# Patient Record
Sex: Female | Born: 1985 | Race: Black or African American | Hispanic: No | Marital: Married | Smoking: Former smoker
Health system: Southern US, Community
[De-identification: ages and names within clinical notes are randomized; demographics above are authoritative.]

## PROBLEM LIST (undated history)

## (undated) ENCOUNTER — Inpatient Hospital Stay (HOSPITAL_COMMUNITY): Payer: Self-pay

## (undated) DIAGNOSIS — F419 Anxiety disorder, unspecified: Secondary | ICD-10-CM

## (undated) DIAGNOSIS — E669 Obesity, unspecified: Secondary | ICD-10-CM

## (undated) DIAGNOSIS — D219 Benign neoplasm of connective and other soft tissue, unspecified: Secondary | ICD-10-CM

## (undated) DIAGNOSIS — E119 Type 2 diabetes mellitus without complications: Secondary | ICD-10-CM

## (undated) DIAGNOSIS — I10 Essential (primary) hypertension: Secondary | ICD-10-CM

## (undated) DIAGNOSIS — O039 Complete or unspecified spontaneous abortion without complication: Secondary | ICD-10-CM

## (undated) DIAGNOSIS — D649 Anemia, unspecified: Secondary | ICD-10-CM

## (undated) DIAGNOSIS — Z8619 Personal history of other infectious and parasitic diseases: Secondary | ICD-10-CM

## (undated) HISTORY — DX: Type 2 diabetes mellitus without complications: E11.9

## (undated) HISTORY — PX: OTHER SURGICAL HISTORY: SHX169

## (undated) HISTORY — DX: Personal history of other infectious and parasitic diseases: Z86.19

## (undated) HISTORY — DX: Complete or unspecified spontaneous abortion without complication: O03.9

---
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
## 2007-08-28 ENCOUNTER — Emergency Department (HOSPITAL_COMMUNITY): Admission: EM | Admit: 2007-08-28 | Discharge: 2007-08-29 | Payer: Self-pay | Admitting: Emergency Medicine

## 2008-01-03 ENCOUNTER — Emergency Department (HOSPITAL_COMMUNITY): Admission: EM | Admit: 2008-01-03 | Discharge: 2008-01-03 | Payer: Self-pay | Admitting: Emergency Medicine

## 2009-01-03 ENCOUNTER — Emergency Department (HOSPITAL_COMMUNITY): Admission: EM | Admit: 2009-01-03 | Discharge: 2009-01-03 | Payer: Self-pay | Admitting: Emergency Medicine

## 2009-06-23 ENCOUNTER — Emergency Department (HOSPITAL_COMMUNITY): Admission: EM | Admit: 2009-06-23 | Discharge: 2009-06-23 | Payer: Self-pay | Admitting: Emergency Medicine

## 2010-03-14 ENCOUNTER — Emergency Department (HOSPITAL_COMMUNITY): Admission: EM | Admit: 2010-03-14 | Discharge: 2010-03-14 | Payer: Self-pay | Admitting: Family Medicine

## 2010-03-16 ENCOUNTER — Emergency Department (HOSPITAL_COMMUNITY): Admission: EM | Admit: 2010-03-16 | Discharge: 2010-03-16 | Payer: Self-pay | Admitting: Emergency Medicine

## 2010-09-19 LAB — URINE CULTURE: Culture  Setup Time: 201109082207

## 2010-09-19 LAB — POCT URINALYSIS DIPSTICK
Specific Gravity, Urine: 1.025 (ref 1.005–1.030)
Urobilinogen, UA: 1 mg/dL (ref 0.0–1.0)

## 2010-10-07 LAB — URINALYSIS, ROUTINE W REFLEX MICROSCOPIC
Hgb urine dipstick: NEGATIVE
Ketones, ur: NEGATIVE mg/dL
Nitrite: NEGATIVE
Specific Gravity, Urine: 1.026 (ref 1.005–1.030)

## 2010-10-07 LAB — URINE MICROSCOPIC-ADD ON

## 2010-10-07 LAB — POCT PREGNANCY, URINE: Preg Test, Ur: NEGATIVE

## 2010-10-29 ENCOUNTER — Emergency Department (HOSPITAL_COMMUNITY)
Admission: EM | Admit: 2010-10-29 | Discharge: 2010-10-30 | Disposition: A | Discharge status: Home / Self Care | Payer: Managed Care, Other (non HMO) | Attending: Emergency Medicine | Admitting: Emergency Medicine

## 2010-10-29 DIAGNOSIS — R059 Cough, unspecified: Secondary | ICD-10-CM | POA: Insufficient documentation

## 2010-10-29 DIAGNOSIS — R05 Cough: Secondary | ICD-10-CM | POA: Insufficient documentation

## 2010-10-29 DIAGNOSIS — J111 Influenza due to unidentified influenza virus with other respiratory manifestations: Secondary | ICD-10-CM | POA: Insufficient documentation

## 2010-10-30 ENCOUNTER — Emergency Department (HOSPITAL_COMMUNITY): Payer: Managed Care, Other (non HMO)

## 2011-08-18 ENCOUNTER — Other Ambulatory Visit: Payer: Self-pay | Admitting: Emergency Medicine

## 2011-08-18 DIAGNOSIS — R1011 Right upper quadrant pain: Secondary | ICD-10-CM

## 2011-08-20 ENCOUNTER — Other Ambulatory Visit: Payer: Managed Care, Other (non HMO)

## 2011-12-30 ENCOUNTER — Encounter (HOSPITAL_COMMUNITY): Payer: Self-pay | Admitting: Emergency Medicine

## 2011-12-30 ENCOUNTER — Emergency Department (HOSPITAL_COMMUNITY)
Admission: EM | Admit: 2011-12-30 | Discharge: 2011-12-30 | Disposition: A | Discharge status: Home / Self Care | Payer: Managed Care, Other (non HMO) | Attending: Emergency Medicine | Admitting: Emergency Medicine

## 2011-12-30 DIAGNOSIS — R109 Unspecified abdominal pain: Secondary | ICD-10-CM | POA: Insufficient documentation

## 2011-12-30 LAB — URINALYSIS, MICROSCOPIC ONLY
Bilirubin Urine: NEGATIVE
Glucose, UA: NEGATIVE mg/dL
Hgb urine dipstick: NEGATIVE
Leukocytes, UA: NEGATIVE
Specific Gravity, Urine: 1.032 — ABNORMAL HIGH (ref 1.005–1.030)
Urobilinogen, UA: 1 mg/dL (ref 0.0–1.0)

## 2011-12-30 NOTE — ED Notes (Signed)
Pt states she has pain in her right flank that started about 2 hrs ago  Pt states it varies in intensity  Pt has nausea without vomiting

## 2011-12-31 ENCOUNTER — Emergency Department (HOSPITAL_COMMUNITY): Payer: Managed Care, Other (non HMO)

## 2011-12-31 ENCOUNTER — Emergency Department (HOSPITAL_COMMUNITY)
Admission: EM | Admit: 2011-12-31 | Discharge: 2011-12-31 | Disposition: A | Discharge status: Home / Self Care | Payer: Managed Care, Other (non HMO) | Attending: Emergency Medicine | Admitting: Emergency Medicine

## 2011-12-31 ENCOUNTER — Encounter (HOSPITAL_COMMUNITY): Payer: Self-pay | Admitting: *Deleted

## 2011-12-31 DIAGNOSIS — R10813 Right lower quadrant abdominal tenderness: Secondary | ICD-10-CM | POA: Insufficient documentation

## 2011-12-31 DIAGNOSIS — M549 Dorsalgia, unspecified: Secondary | ICD-10-CM

## 2011-12-31 DIAGNOSIS — R109 Unspecified abdominal pain: Secondary | ICD-10-CM | POA: Insufficient documentation

## 2011-12-31 LAB — POCT PREGNANCY, URINE: Preg Test, Ur: NEGATIVE

## 2011-12-31 LAB — URINALYSIS, ROUTINE W REFLEX MICROSCOPIC
Glucose, UA: NEGATIVE mg/dL
Ketones, ur: NEGATIVE mg/dL
Leukocytes, UA: NEGATIVE

## 2011-12-31 MED ORDER — HYDROCODONE-ACETAMINOPHEN 5-325 MG PO TABS: 1.0000 | ORAL_TABLET | Freq: Four times a day (QID) | ORAL | Status: AC | PRN

## 2011-12-31 MED ORDER — IBUPROFEN 800 MG PO TABS: 800.0000 mg | ORAL_TABLET | Freq: Two times a day (BID) | ORAL | Status: AC

## 2011-12-31 MED ORDER — OXYCODONE-ACETAMINOPHEN 5-325 MG PO TABS
1.0000 | ORAL_TABLET | Freq: Once | ORAL | Status: AC
  Administered 2011-12-31: 1 via ORAL
  Filled 2011-12-31: qty 1

## 2011-12-31 NOTE — ED Notes (Signed)
Pt reports lower R side and R lower back pain x2 days. Constant nagging pain, sharp at times. Pain worse with movement. Denies urinary or vaginal symptoms.

## 2011-12-31 NOTE — Discharge Instructions (Signed)
Followup with orthopedics if symptoms continue. Use conservative methods at home including heat therapy and cold therapy as we discussed. More information on cold therapy is listed below.  It is not reccommended to use heat treatment directly after an acute injury. ° °SEEK IMMEDIATE MEDICAL ATTENTION IF: °New numbness, tingling, weakness, or problem with the use of your arms or legs.  °Severe back pain not relieved with medications.  °Change in bowel or bladder control.  °Increasing pain in any areas of the body (such as chest or abdominal pain).  °Shortness of breath, dizziness or fainting.  °Nausea (feeling sick to your stomach), vomiting, fever, or sweats. ° °COLD THERAPY DIRECTIONS:  °Ice or gel packs can be used to reduce both pain and swelling. Ice is the most helpful within the first 24 to 48 hours after an injury or flareup from overusing a muscle or joint.  Ice is effective, has very few side effects, and is safe for most people to use.  ° °If you expose your skin to cold temperatures for too long or without the proper protection, you can damage your skin or nerves. Watch for signs of skin damage due to cold.  ° °HOME CARE INSTRUCTIONS  °Follow these tips to use ice and cold packs safely.  °Place a dry or damp towel between the ice and skin. A damp towel will cool the skin more quickly, so you may need to shorten the time that the ice is used.  °For a more rapid response, add gentle compression to the ice.  °Ice for no more than 10 to 20 minutes at a time. The bonier the area you are icing, the less time it will take to get the benefits of ice.  °Check your skin after 5 minutes to make sure there are no signs of a poor response to cold or skin damage.  °Rest 20 minutes or more in between uses.  °Once your skin is numb, you can end your treatment. You can test numbness by very lightly touching your skin. The touch should be so light that you do not see the skin dimple from the pressure of your fingertip.  When using ice, most people will feel these normal sensations in this order: cold, burning, aching, and numbness.  °Do not use ice on someone who cannot communicate their responses to pain, such as small children or people with dementia.  ° °HOW TO MAKE AN ICE PACK  °To make an ice pack, do one of the following:  °Place crushed ice or a bag of frozen vegetables in a sealable plastic bag. Squeeze out the excess air. Place this bag inside another plastic bag. Slide the bag into a pillowcase or place a damp towel between your skin and the bag.  °Mix 3 parts water with 1 part rubbing alcohol. Freeze the mixture in a sealable plastic bag. When you remove the mixture from the freezer, it will be slushy. Squeeze out the excess air. Place this bag inside another plastic bag. Slide the bag into a pillowcase or place a damp towel between your skin and the bag.  ° °SEEK MEDICAL CARE IF:  °You develop white spots on your skin. This may give the skin a blotchy (mottled) appearance.  °Your skin turns blue or pale.  °Your skin becomes waxy or hard.  °Your swelling gets worse.  °MAKE SURE YOU:  °Understand these instructions.  °Will watch your condition.  °Will get help right away if you are not doing well or   get worse.  ° ° ° ° ° ° °

## 2011-12-31 NOTE — ED Provider Notes (Signed)
History     CSN: [id number]  Arrival date & time 12/31/11  1335   First MD Initiated Contact with Patient 12/31/11 1635      Chief Complaint  Patient presents with  . Back Pain    (Consider location/radiation/quality/duration/timing/severity/associated sxs/prior treatment) HPI Comments: Patient with no significant past medical history presents emergency department with right-sided flank pain.  Onset of symptoms began 2 days ago and is described as a constant nagging pain that is sharp at times.  Pain radiates to right lower quadrant.  Associated symptoms include dysuria and urinary frequency.  Patient denies any vaginal discharge, dyspareunia, fever, night sweats, chills, abdominal pain, nausea, vomiting, constipation.  No other complaint at this time.  Patient is a 26 y.o. female presenting with back pain. The history is provided by the patient.  Back Pain     History reviewed. No pertinent past medical history.  Past Surgical History  Procedure Date  . Extraction of wisdom teeth     Family History  Problem Relation Age of Onset  . Hypertension Other   . Diabetes Other     History  Substance Use Topics  . Smoking status: Never Smoker   . Smokeless tobacco: Not on file  . Alcohol Use: No    OB History    Grav Para Term Preterm Abortions TAB SAB Ect Mult Living                  Review of Systems  Musculoskeletal: Positive for back pain.  All other systems reviewed and are negative.    Allergies  Review of patient's allergies indicates no known allergies.  Home Medications  No current outpatient prescriptions on file.  BP 126/44  Pulse 85  Temp 98.6 F (37 C) (Oral)  Resp 16  Ht 5\' 3"  (1.6 m)  Wt 225 lb (102.059 kg)  BMI 39.86 kg/m2  SpO2 100%  LMP 12/12/2011  Physical Exam  Nursing note and vitals reviewed. Constitutional: She is oriented to person, place, and time. She appears well-developed and well-nourished. No distress.  HENT:  Head:  Normocephalic and atraumatic.  Eyes: Conjunctivae and EOM are normal.  Neck: Normal range of motion.  Pulmonary/Chest: Effort normal.  Abdominal: There is tenderness in the right lower quadrant. There is CVA tenderness (right ). There is no rigidity, no guarding, no tenderness at McBurney's point and negative Murphy's sign.  Musculoskeletal: Normal range of motion.  Neurological: She is alert and oriented to person, place, and time.  Skin: Skin is warm and dry. No rash noted. She is not diaphoretic.  Psychiatric: She has a normal mood and affect. Her behavior is normal.    ED Course  Procedures (including critical care time)   Labs Reviewed  URINALYSIS, ROUTINE W REFLEX MICROSCOPIC  POCT PREGNANCY, URINE   Ct Abdomen Pelvis Wo Contrast  12/31/2011  *RADIOLOGY REPORT*  Clinical Data: Right back and right flank pain.  CT ABDOMEN AND PELVIS WITHOUT CONTRAST  Technique:  Multidetector CT imaging of the abdomen and pelvis was performed following the standard protocol without intravenous contrast.  Comparison: None.  Findings: The visualized portion of the liver, spleen, pancreas, and adrenal glands appear unremarkable in noncontrast CT appearance.  The gallbladder and biliary system appear unremarkable.  The kidneys appear unremarkable, as do the proximal ureters.  No pathologic retroperitoneal or porta hepatis adenopathy is identified.  No pathologic pelvic adenopathy is identified.  Trace free pelvic fluid noted.  The appendix appears normal.  IMPRESSION:  1.  No specific abnormality is observed explain the patient's right- sided back pain. 2.  Trace free pelvic fluid, possibly physiologic.  Original Report Authenticated By: Dellia Cloud, M.D.     No diagnosis found.   Patient refused pelvic exam  MDM  Back pain radiating to RLQ  Patient with back pain.  No neurological deficits and normal neuro exam.  Patient can walk but states is painful.  No loss of bowel or bladder control.   No concern for cauda equina. No UTI or kidney stone. No fever, night sweats, weight loss, h/o cancer, IVDU.  RICE protocol and pain medicine indicated and discussed with patient.          Jaci Carrel, [place] 12/31/11 2001

## 2012-01-01 NOTE — ED Provider Notes (Signed)
Medical screening examination/treatment/procedure(s) were performed by non-physician practitioner and as supervising physician I was immediately available for consultation/collaboration.   Brittley Regner Y. Netanel Yannuzzi, MD 01/01/12 0019 

## 2012-01-16 ENCOUNTER — Encounter (HOSPITAL_COMMUNITY): Payer: Self-pay | Admitting: *Deleted

## 2012-01-16 ENCOUNTER — Inpatient Hospital Stay (HOSPITAL_COMMUNITY)
Admission: AD | Admit: 2012-01-16 | Discharge: 2012-01-17 | Disposition: A | Discharge status: Home / Self Care | Payer: Managed Care, Other (non HMO) | Source: Ambulatory Visit | Attending: Obstetrics and Gynecology | Admitting: Obstetrics and Gynecology

## 2012-01-16 ENCOUNTER — Inpatient Hospital Stay (HOSPITAL_COMMUNITY): Payer: Managed Care, Other (non HMO)

## 2012-01-16 DIAGNOSIS — M549 Dorsalgia, unspecified: Secondary | ICD-10-CM

## 2012-01-16 DIAGNOSIS — O219 Vomiting of pregnancy, unspecified: Secondary | ICD-10-CM

## 2012-01-16 DIAGNOSIS — R1031 Right lower quadrant pain: Secondary | ICD-10-CM

## 2012-01-16 DIAGNOSIS — O210 Mild hyperemesis gravidarum: Secondary | ICD-10-CM | POA: Insufficient documentation

## 2012-01-16 HISTORY — DX: Anemia, unspecified: D64.9

## 2012-01-16 LAB — URINALYSIS, ROUTINE W REFLEX MICROSCOPIC
Bilirubin Urine: NEGATIVE
Nitrite: NEGATIVE
Protein, ur: NEGATIVE mg/dL
Specific Gravity, Urine: 1.025 (ref 1.005–1.030)
pH: 6 (ref 5.0–8.0)

## 2012-01-16 LAB — CBC WITH DIFFERENTIAL/PLATELET
Eosinophils Relative: 2 % (ref 0–5)
HCT: 34.3 % — ABNORMAL LOW (ref 36.0–46.0)
Hemoglobin: 10.9 g/dL — ABNORMAL LOW (ref 12.0–15.0)
Lymphocytes Relative: 25 % (ref 12–46)
Lymphs Abs: 2.3 10*3/uL (ref 0.7–4.0)
MCV: 70.4 fL — ABNORMAL LOW (ref 78.0–100.0)
Monocytes Absolute: 0.9 10*3/uL (ref 0.1–1.0)
Monocytes Relative: 9 % (ref 3–12)
RBC: 4.87 MIL/uL (ref 3.87–5.11)
RDW: 14.9 % (ref 11.5–15.5)
WBC: 9.4 10*3/uL (ref 4.0–10.5)

## 2012-01-16 LAB — URINE MICROSCOPIC-ADD ON

## 2012-01-16 LAB — WET PREP, GENITAL: Yeast Wet Prep HPF POC: NONE SEEN

## 2012-01-16 LAB — POCT PREGNANCY, URINE: Preg Test, Ur: POSITIVE — AB

## 2012-01-16 MED ORDER — PROMETHAZINE HCL 25 MG PO TABS: 25.0000 mg | ORAL_TABLET | Freq: Four times a day (QID) | ORAL | Status: DC | PRN

## 2012-01-16 MED ORDER — DOCUSATE SODIUM 100 MG PO CAPS: 100.0000 mg | ORAL_CAPSULE | Freq: Two times a day (BID) | ORAL | Status: AC

## 2012-01-16 MED ORDER — ONDANSETRON 8 MG PO TBDP: 8.0000 mg | ORAL_TABLET | Freq: Three times a day (TID) | ORAL | Status: AC | PRN

## 2012-01-16 MED ORDER — PROMETHAZINE HCL 25 MG PO TABS
25.0000 mg | ORAL_TABLET | Freq: Once | ORAL | Status: AC
  Administered 2012-01-16: 25 mg via ORAL
  Filled 2012-01-16: qty 1

## 2012-01-16 NOTE — MAU Note (Signed)
Pt states her rt sided back/flank and lumbar pain started about 3 days ago.  Intermittent in nature and describes as a dull pain.

## 2012-01-16 NOTE — MAU Provider Note (Signed)
History     CSN: [id number]  Arrival date and time: 01/16/12 2002   First Provider Initiated Contact with Patient 01/16/12 2120      Chief Complaint  Patient presents with  . Emesis During Pregnancy  . Back Pain   HPI Pt is G1P0 5 weeks 0 days pregnant and complains of right back pain since 01/13/2012- she had a fever no chills 3 days ago.  She has been nauseated and vomiting for 3 days.  She ate at 3:30pm a slice of pizza which did not stay down.  She denies vaginal discharge, bleeding or UTI symptoms.  She has been constipated with last bowel movement 3 days ago, which was hard.  She has had this pain like this and was seen several weeks ago at the ED and told she had a muscle strain and was given hydrocodone for pain.  She also has some lower abdominal cramping pain.    Past Medical History  Diagnosis Date  . Anemia     Past Surgical History  Procedure Date  . Extraction of wisdom teeth     Family History  Problem Relation Age of Onset  . Hypertension Other   . Diabetes Other   . Other Neg Hx     History  Substance Use Topics  . Smoking status: Former Smoker    Quit date: 01/16/2011  . Smokeless tobacco: Not on file  . Alcohol Use: No    Allergies: No Known Allergies  Prescriptions prior to admission  Medication Sig Dispense Refill  . HYDROcodone-acetaminophen (NORCO) 5-325 MG per tablet Take 1 tablet by mouth every 6 (six) hours as needed. Back pain        Review of Systems  Constitutional: Negative for fever and chills.  Gastrointestinal: Positive for nausea, vomiting and abdominal pain.  Genitourinary: Negative for dysuria, urgency and frequency.  Musculoskeletal: Positive for back pain.  Neurological: Negative for headaches.   Physical Exam   Blood pressure 132/65, pulse 89, temperature 98.4 F (36.9 C), temperature source Oral, resp. rate 16, height 6\' 2"  (1.88 m), weight 228 lb 12.8 oz (103.783 kg), last menstrual period 12/12/2011, SpO2  100.00%.  Physical Exam  Nursing note and vitals reviewed. Constitutional: She is oriented to person, place, and time. She appears well-developed and well-nourished.  HENT:  Head: Normocephalic.  Eyes: Pupils are equal, round, and reactive to light.  Neck: Normal range of motion. Neck supple.  Cardiovascular: Normal rate.   Respiratory: Effort normal.  GI: Soft. Bowel sounds are normal. She exhibits no distension. There is no tenderness. There is no rebound and no guarding.       No CVA tenderness  Genitourinary: Vagina normal. No vaginal discharge found.       Uterus irregular with multiple fibroids palpated- nontender; uterine size difficult to assess due to fibroids; no appreciable adnexal tenderness or enlargement  Musculoskeletal: Normal range of motion.  Neurological: She is alert and oriented to person, place, and time.  Skin: Skin is warm and dry.  Psychiatric: She has a normal mood and affect.    MAU Course  Procedures Results for orders placed during the hospital encounter of 01/16/12 (from the past 24 hour(s))  URINALYSIS, ROUTINE W REFLEX MICROSCOPIC     Status: Abnormal   Collection Time   01/16/12  8:21 PM      Component Value Range   Color, Urine YELLOW  YELLOW   APPearance CLEAR  CLEAR   Specific Gravity, Urine 1.025  1.005 -  1.030   pH 6.0  5.0 - 8.0   Glucose, UA NEGATIVE  NEGATIVE mg/dL   Hgb urine dipstick NEGATIVE  NEGATIVE   Bilirubin Urine NEGATIVE  NEGATIVE   Ketones, ur 15 (*) NEGATIVE mg/dL   Protein, ur NEGATIVE  NEGATIVE mg/dL   Urobilinogen, UA 4.0 (*) 0.0 - 1.0 mg/dL   Nitrite NEGATIVE  NEGATIVE   Leukocytes, UA TRACE (*) NEGATIVE  URINE MICROSCOPIC-ADD ON     Status: Abnormal   Collection Time   01/16/12  8:21 PM      Component Value Range   Squamous Epithelial / LPF MANY (*) RARE   WBC, UA 3-6  <3 WBC/hpf   Bacteria, UA FEW (*) RARE   Urine-Other MUCOUS PRESENT    POCT PREGNANCY, URINE     Status: Abnormal   Collection Time   01/16/12   8:28 PM      Component Value Range   Preg Test, Ur POSITIVE (*) NEGATIVE  CBC WITH DIFFERENTIAL     Status: Abnormal   Collection Time   01/16/12  9:06 PM      Component Value Range   WBC 9.4  4.0 - 10.5 K/uL   RBC 4.87  3.87 - 5.11 MIL/uL   Hemoglobin 10.9 (*) 12.0 - 15.0 g/dL   HCT 04.5 (*) 40.9 - 81.1 %   MCV 70.4 (*) 78.0 - 100.0 fL   MCH 22.4 (*) 26.0 - 34.0 pg   MCHC 31.8  30.0 - 36.0 g/dL   RDW 91.4  78.2 - 95.6 %   Platelets 321  150 - 400 K/uL   Neutrophils Relative 64  43 - 77 %   Neutro Abs 6.0  1.7 - 7.7 K/uL   Lymphocytes Relative 25  12 - 46 %   Lymphs Abs 2.3  0.7 - 4.0 K/uL   Monocytes Relative 9  3 - 12 %   Monocytes Absolute 0.9  0.1 - 1.0 K/uL   Eosinophils Relative 2  0 - 5 %   Eosinophils Absolute 0.2  0.0 - 0.7 K/uL   Basophils Relative 0  0 - 1 %   Basophils Absolute 0.0  0.0 - 0.1 K/uL  HCG, QUANTITATIVE, PREGNANCY     Status: Abnormal   Collection Time   01/16/12  9:06 PM      Component Value Range   hCG, Beta Chain, Quant, S 14122 (*) <5 mIU/mL  ABO/RH     Status: Normal   Collection Time   01/16/12  9:06 PM      Component Value Range   ABO/RH(D) O POS    pt given PO Phenergan 25mg  and was able to tolerate PO fluids Ultrasound showed IUGS measuring [redacted]w[redacted]d with YS but no fetal pole or embryo; multiple small uterine  fibroids noted; no adnexal mass or free fluid identified Discussed with Dr. Claiborne Billings- will have pt follow up on Monday morning for repeat [facility]- sooner if bleeding or pain; will give prescriptions for Phenergan and Zofran Assessment and Plan  Nausea and vomiting in Pregnancy HCG 14,122 with IUGS and YS but no fetal pole F/u in 2 days for repeat HCG   Ghadeer Kastelic 01/16/2012, 9:21 PM

## 2012-01-20 ENCOUNTER — Other Ambulatory Visit: Payer: Self-pay | Admitting: Internal Medicine

## 2012-01-20 ENCOUNTER — Other Ambulatory Visit (HOSPITAL_COMMUNITY): Payer: Self-pay | Admitting: Nurse Practitioner

## 2012-01-20 DIAGNOSIS — M549 Dorsalgia, unspecified: Secondary | ICD-10-CM

## 2012-01-20 DIAGNOSIS — R1031 Right lower quadrant pain: Secondary | ICD-10-CM

## 2012-04-07 ENCOUNTER — Encounter (HOSPITAL_COMMUNITY): Payer: Self-pay | Admitting: *Deleted

## 2012-04-07 ENCOUNTER — Inpatient Hospital Stay (HOSPITAL_COMMUNITY)
Admission: AD | Admit: 2012-04-07 | Discharge: 2012-04-07 | Disposition: A | Discharge status: Home / Self Care | Payer: Managed Care, Other (non HMO) | Source: Ambulatory Visit | Attending: Obstetrics & Gynecology | Admitting: Obstetrics & Gynecology

## 2012-04-07 DIAGNOSIS — R109 Unspecified abdominal pain: Secondary | ICD-10-CM | POA: Insufficient documentation

## 2012-04-07 DIAGNOSIS — O99891 Other specified diseases and conditions complicating pregnancy: Secondary | ICD-10-CM | POA: Insufficient documentation

## 2012-04-07 DIAGNOSIS — O99619 Diseases of the digestive system complicating pregnancy, unspecified trimester: Secondary | ICD-10-CM

## 2012-04-07 HISTORY — DX: Benign neoplasm of connective and other soft tissue, unspecified: D21.9

## 2012-04-07 LAB — URINALYSIS, ROUTINE W REFLEX MICROSCOPIC
Glucose, UA: NEGATIVE mg/dL
Hgb urine dipstick: NEGATIVE
Leukocytes, UA: NEGATIVE
Specific Gravity, Urine: 1.02 (ref 1.005–1.030)
pH: 6 (ref 5.0–8.0)

## 2012-04-07 NOTE — MAU Note (Signed)
Constant pelvic pressure/pain for past hour.

## 2012-04-07 NOTE — MAU Provider Note (Signed)
Chief Complaint: pelvic pressure    First Provider Initiated Contact with Patient 04/07/12 1904     SUBJECTIVE HPI: April Lucas is a 26 y.o. G2P1001 at [redacted]w[redacted]d by LMP who presents with low abdominal pressure x 1-2 hours and severe constipation. Only had 1 small BM in the past two weeks. Denies VB, LOF, vaginal discharge, urinary complaints, N/V, fever. Has not tried anything for constipation.   Past Medical History  Diagnosis Date  . Anemia   . Fibroid    OB History    Grav Para Term Preterm Abortions TAB SAB Ect Mult Living   2 1 1       1      # Outc Date GA Lbr Len/2nd Wgt Sex Del Anes PTL Lv   1 TRM 4/07    F SVD EPI No Yes   2 CUR              Past Surgical History  Procedure Date  . Extraction of wisdom teeth    History   Social History  . Marital Status: Single    Spouse Name: N/A    Number of Children: N/A  . Years of Education: N/A   Occupational History  . Not on file.   Social History Main Topics  . Smoking status: Former Games developer  . Smokeless tobacco: Never Used   Comment: as teenager  . Alcohol Use: No     not with preg  . Drug Use: No  . Sexually Active: Yes    Birth Control/ Protection: None   Other Topics Concern  . Not on file   Social History Narrative  . No narrative on file   No current facility-administered medications on file prior to encounter.   Current Outpatient Prescriptions on File Prior to Encounter  Medication Sig Dispense Refill  . promethazine (PHENERGAN) 25 MG tablet Take 1 tablet (25 mg total) by mouth every 6 (six) hours as needed for nausea.  30 tablet  0   No Known Allergies  ROS: Pertinent items in HPI  OBJECTIVE Blood pressure 128/73, pulse 97, temperature 98.4 F (36.9 C), temperature source Oral, resp. rate 20, height 5\' 3"  (1.6 m), weight 103.42 kg (228 lb), last menstrual period 12/12/2011. GENERAL: Well-developed, well-nourished female in no acute distress.  HEENT: Normocephalic HEART: normal rate RESP:  normal effort ABDOMEN: Soft, non-tender EXTREMITIES: Nontender, no edema NEURO: Alert and oriented SPECULUM EXAM: deferred BIMANUAL: cervix long and closed; 18 week size, no adnexal tenderness or masses. Stool in rectum. FHR 165 by doppler  LAB RESULTS Results for orders placed during the hospital encounter of 04/07/12 (from the past 24 hour(s))  URINALYSIS, ROUTINE W REFLEX MICROSCOPIC     Status: Abnormal   Collection Time   04/07/12  5:50 PM      Component Value Range   Color, Urine YELLOW  YELLOW   APPearance CLEAR  CLEAR   Specific Gravity, Urine 1.020  1.005 - 1.030   pH 6.0  5.0 - 8.0   Glucose, UA NEGATIVE  NEGATIVE mg/dL   Hgb urine dipstick NEGATIVE  NEGATIVE   Bilirubin Urine NEGATIVE  NEGATIVE   Ketones, ur 40 (*) NEGATIVE mg/dL   Protein, ur NEGATIVE  NEGATIVE mg/dL   Urobilinogen, UA 0.2  0.0 - 1.0 mg/dL   Nitrite NEGATIVE  NEGATIVE   Leukocytes, UA NEGATIVE  NEGATIVE    IMAGING No results found.  MAU COURSE Pt offered soap suds enema, declined. VM left w/ Dr. Arlyce Dice.  ASSESSMENT  1. Constipation in pregnancy    PLAN Discharge home You may use Miralax or Milk of Magnesia daily until soft, daily bowel movement. Then take Colace 1-2 times per day for maintenance of regular BMs. If no BM in 24 hours use a Fleets enema. If no BM in 48 hours return to MAU for soap suds enema.   Follow-up Information    Follow up with Mickel Baas, MD. (as scheduled )    Contact information:   [address] [place] [address] [phone]       Follow up with [facility] [place] MATERNITY ADMISSIONS. (As needed if no relief in 48 hours.)    Contact information:   [address] [id number] mc [place] [address] [phone]          Medication List     As of 04/07/2012  7:15 PM    ASK your doctor about these medications         prenatal multivitamin Tabs   Take 1 tablet by mouth every morning.       promethazine 25 MG tablet   Commonly known as: PHENERGAN   Take 1 tablet (25 mg total) by mouth every 6 (six) hours as needed for nausea.        [place], CNM 04/07/2012  7:15 PM

## 2012-04-09 ENCOUNTER — Inpatient Hospital Stay (HOSPITAL_COMMUNITY): Payer: Managed Care, Other (non HMO)

## 2012-04-09 ENCOUNTER — Encounter (HOSPITAL_COMMUNITY): Payer: Self-pay | Admitting: *Deleted

## 2012-04-09 ENCOUNTER — Inpatient Hospital Stay (HOSPITAL_COMMUNITY)
Admission: AD | Admit: 2012-04-09 | Discharge: 2012-04-09 | Disposition: A | Discharge status: Home / Self Care | Payer: Managed Care, Other (non HMO) | Source: Ambulatory Visit | Attending: Obstetrics and Gynecology | Admitting: Obstetrics and Gynecology

## 2012-04-09 DIAGNOSIS — R1032 Left lower quadrant pain: Secondary | ICD-10-CM | POA: Insufficient documentation

## 2012-04-09 DIAGNOSIS — M545 Low back pain, unspecified: Secondary | ICD-10-CM | POA: Insufficient documentation

## 2012-04-09 DIAGNOSIS — O4690 Antepartum hemorrhage, unspecified, unspecified trimester: Secondary | ICD-10-CM

## 2012-04-09 DIAGNOSIS — O209 Hemorrhage in early pregnancy, unspecified: Secondary | ICD-10-CM | POA: Insufficient documentation

## 2012-04-09 DIAGNOSIS — O3410 Maternal care for benign tumor of corpus uteri, unspecified trimester: Secondary | ICD-10-CM | POA: Insufficient documentation

## 2012-04-09 DIAGNOSIS — D259 Leiomyoma of uterus, unspecified: Secondary | ICD-10-CM | POA: Insufficient documentation

## 2012-04-09 LAB — URINALYSIS, ROUTINE W REFLEX MICROSCOPIC
Glucose, UA: NEGATIVE mg/dL
Leukocytes, UA: NEGATIVE
Nitrite: NEGATIVE
pH: 6 (ref 5.0–8.0)

## 2012-04-09 MED ORDER — NALBUPHINE SYRINGE 5 MG/0.5 ML
10.0000 mg | INJECTION | INTRAMUSCULAR | Status: DC | PRN
  Administered 2012-04-09: 10 mg via INTRAVENOUS
  Filled 2012-04-09: qty 1

## 2012-04-09 MED ORDER — IBUPROFEN 600 MG PO TABS
600.0000 mg | ORAL_TABLET | Freq: Once | ORAL | Status: AC
  Administered 2012-04-09: 600 mg via ORAL
  Filled 2012-04-09: qty 1

## 2012-04-09 MED ORDER — LACTATED RINGERS IV SOLN
INTRAVENOUS | Status: DC
  Administered 2012-04-09: 22:00:00 via INTRAVENOUS

## 2012-04-09 MED ORDER — LACTATED RINGERS IV SOLN: Freq: Once | INTRAVENOUS | Status: DC

## 2012-04-09 NOTE — MAU Note (Signed)
Low back pain -starts in back comes around to front since she left.  Has had a bowel movement since she was here.  Has been nauseated. Cramping with urination

## 2012-04-09 NOTE — MAU Provider Note (Signed)
History     CSN: [id number]  Arrival date and time: 04/09/12 1753   First Provider Initiated Contact with Patient 04/09/12 2114      Chief Complaint  Patient presents with  . Back Pain   HPI This is a 26 y.o. female at [redacted]w[redacted]d who presents for eval of LLQ pain which she has had for 2 days. States it also hurts in her lower back. ?some tightening. Was seen 2 days ago and found to be very constipated. States she had a BM but did not get pain relief afterward. Denies leaking or bleeding. Has nausea. Denies fever or vomiting.   OB History    Grav Para Term Preterm Abortions TAB SAB Ect Mult Living   2 1 1       1       Past Medical History  Diagnosis Date  . Anemia   . Fibroid     Past Surgical History  Procedure Date  . Extraction of wisdom teeth     Family History  Problem Relation Age of Onset  . Hypertension Other   . Diabetes Other   . Other Neg Hx   . Hearing loss Neg Hx   . Hypertension Father   . Diabetes Father     History  Substance Use Topics  . Smoking status: Former Games developer  . Smokeless tobacco: Never Used   Comment: as teenager  . Alcohol Use: No     not with preg    Allergies: No Known Allergies  Prescriptions prior to admission  Medication Sig Dispense Refill  . acetaminophen (TYLENOL) 500 MG tablet Take 500 mg by mouth every 6 (six) hours as needed. Lower abdominal pain      . Prenatal Vit-Fe Fumarate-FA (PRENATAL MULTIVITAMIN) TABS Take 1 tablet by mouth every morning.      . promethazine (PHENERGAN) 25 MG tablet Take 1 tablet (25 mg total) by mouth every 6 (six) hours as needed for nausea.  30 tablet  0    ROS See HPI  Physical Exam   Blood pressure 137/74, pulse 112, temperature 98.6 F (37 C), temperature source Oral, resp. rate 20, last menstrual period 12/12/2011.  Physical Exam  Constitutional: She is oriented to person, place, and time. She appears well-developed and well-nourished. No distress.  Cardiovascular: Normal rate.     Respiratory: Effort normal.  GI: Soft. She exhibits no distension and no mass. There is tenderness (throughout, but more in LLQ). There is no rebound and no guarding.  Genitourinary: Uterus normal. Vaginal discharge (dark red/brown on glove) found.       Cervix 1cm ext os/closed internal os/ ?25% effaced/funneled?/-3  Musculoskeletal: Normal range of motion.  Neurological: She is alert and oriented to person, place, and time.  Skin: Skin is warm and dry.  Psychiatric: She has a normal mood and affect.   Results for orders placed during the hospital encounter of 04/09/12 (from the past 24 hour(s))  URINALYSIS, ROUTINE W REFLEX MICROSCOPIC     Status: Abnormal   Collection Time   04/09/12  6:59 PM      Component Value Range   Color, Urine YELLOW  YELLOW   APPearance CLEAR  CLEAR   Specific Gravity, Urine >1.030 (*) 1.005 - 1.030   pH 6.0  5.0 - 8.0   Glucose, UA NEGATIVE  NEGATIVE mg/dL   Hgb urine dipstick NEGATIVE  NEGATIVE   Bilirubin Urine NEGATIVE  NEGATIVE   Ketones, ur >80 (*) NEGATIVE mg/dL   Protein, ur  NEGATIVE  NEGATIVE mg/dL   Urobilinogen, UA 0.2  0.0 - 1.0 mg/dL   Nitrite NEGATIVE  NEGATIVE   Leukocytes, UA NEGATIVE  NEGATIVE   US Ob Limited  04/09/2012  *RADIOLOGY REPORT*  Clinical Data: Vaginal bleeding.  Uterine fibroids.  Obesity.  17 weeks 0 days pregnant by last menstrual period and 18 weeks and 0 days pregnant by previous ultrasound.  TRANSABDOMINAL AND TRANSVAGINAL LIMITED OBSTETRIC ULTRASOUND  Comparison:  01/16/2012.  Number of Fetuses: 1 Heart Rate: 156 bpm Movement: Yes Presentation: Cephalic Placental Location: Anterior Previa: No Amniotic Fluid (Subjective): Normal  Vertical Pocket 5.8 cm  BPD:  3.87 cm     17 w  6 d  MATERNAL FINDINGS: Cervix: Closed, 3.1 cm in length. Uterus/Adnexae:  4.7 x 4.6 x 4.5 cm rounded, mildly heterogeneous mass in the uterus on the left.  3.0 x 2.9 x 2.4 cm oval, similar appearing mass in the posterior aspect of the mid uterus.  Additional 2.8 x 2.6 x 2.3 cm similar appearing left lateral uterine mass.  IMPRESSION:  1.  Single live intrauterine gestation with an estimated gestational age by BPD of 17 weeks and 6 days.  This represents normal growth since the previous ultrasound. 2.  Interval mild increase in size of the three uterine fibroids. 3.  No acute abnormality.  Recommend followup with non-emergent complete OB 14+ wk US examination for fetal biometric evaluation and anatomic survey if not already performed.   Original Report Authenticated By: Darrol Angel, M.D.     MAU Course  Procedures  MDM Discussed with Dr Henderson Cloud >> will check [place] for cx length, placenta and fibroids and IV hydrate >> feels better after fluids. Cramping decreased.  Assessment and Plan  A:  SIUP at [redacted]w[redacted]d       Uterine cramping      Bleeding possibly due to cramping      No previa      Uterine fibroids with interval increase in size      Normal cervical length  P:  Discharge       Pelvic rest       Ibuprofen for 24 hrs       Followup with office PRN and as scheduled  [facility] 04/09/2012, 9:34 PM

## 2012-06-04 ENCOUNTER — Encounter (HOSPITAL_COMMUNITY): Payer: Self-pay | Admitting: Emergency Medicine

## 2012-06-04 ENCOUNTER — Emergency Department (HOSPITAL_COMMUNITY)
Admission: EM | Admit: 2012-06-04 | Discharge: 2012-06-05 | Disposition: A | Discharge status: Home / Self Care | Payer: Managed Care, Other (non HMO) | Attending: Emergency Medicine | Admitting: Emergency Medicine

## 2012-06-04 DIAGNOSIS — K089 Disorder of teeth and supporting structures, unspecified: Secondary | ICD-10-CM | POA: Insufficient documentation

## 2012-06-04 DIAGNOSIS — Z8742 Personal history of other diseases of the female genital tract: Secondary | ICD-10-CM | POA: Insufficient documentation

## 2012-06-04 DIAGNOSIS — Z862 Personal history of diseases of the blood and blood-forming organs and certain disorders involving the immune mechanism: Secondary | ICD-10-CM | POA: Insufficient documentation

## 2012-06-04 DIAGNOSIS — K0889 Other specified disorders of teeth and supporting structures: Secondary | ICD-10-CM

## 2012-06-04 DIAGNOSIS — Z87891 Personal history of nicotine dependence: Secondary | ICD-10-CM | POA: Insufficient documentation

## 2012-06-04 DIAGNOSIS — O9989 Other specified diseases and conditions complicating pregnancy, childbirth and the puerperium: Secondary | ICD-10-CM | POA: Insufficient documentation

## 2012-06-04 MED ORDER — OXYCODONE-ACETAMINOPHEN 5-325 MG PO TABS
2.0000 | ORAL_TABLET | Freq: Once | ORAL | Status: AC
  Administered 2012-06-04: 2 via ORAL
  Filled 2012-06-04: qty 2

## 2012-06-04 NOTE — ED Notes (Signed)
Tia, PA at bedside 

## 2012-06-04 NOTE — ED Notes (Addendum)
PT. REPORTS LEFT UPPER AND LOWER MOLAR PAIN FOR SEVERAL DAYS WORSE THIS EVENING UNRELIEVED BY OTC PAIN MEDICATIONS. STATES 6 MONTHS PREGNANT G2P1 .

## 2012-06-04 NOTE — ED Provider Notes (Signed)
History     CSN: [id number]  Arrival date & time 06/04/12  2202   First MD Initiated Contact with Patient 06/04/12 2207      Chief Complaint  Patient presents with  . Dental Pain    (Consider location/radiation/quality/duration/timing/severity/associated sxs/prior treatment) HPI Comments: Patient is G2P1Ab0 approximately 6 months gestation with complaint of dental pain X 2 days. Patient states that she broke one her left molars before she got pregnant, had a dental abscess and treated with ABX. Oral surgery postponed as patient is pregnant. She rates the pain as 8/10 without radiation or transmission. Denies fevers or chills. Denies facial swelling, tongue swelling, or difficulty swallowing.   The history is provided by the patient. No language interpreter was used.    Past Medical History  Diagnosis Date  . Anemia   . Fibroid     Past Surgical History  Procedure Date  . Extraction of wisdom teeth     Family History  Problem Relation Age of Onset  . Hypertension Other   . Diabetes Other   . Other Neg Hx   . Hearing loss Neg Hx   . Hypertension Father   . Diabetes Father     History  Substance Use Topics  . Smoking status: Former Games developer  . Smokeless tobacco: Never Used     Comment: as teenager  . Alcohol Use: No     Comment: not with preg    OB History    Grav Para Term Preterm Abortions TAB SAB Ect Mult Living   2 1 1       1       Review of Systems  Constitutional: Negative for fever and chills.  HENT: Positive for dental problem. Negative for facial swelling and trouble swallowing.     Allergies  Review of patient's allergies indicates no known allergies.  Home Medications   Current Outpatient Rx  Name  Route  Sig  Dispense  Refill  . ACETAMINOPHEN 500 MG PO TABS   Oral   Take 500 mg by mouth every 6 (six) hours as needed. Lower abdominal pain         . PRENATAL MULTIVITAMIN CH   Oral   Take 1 tablet by mouth every morning.            BP 166/93  Pulse 93  Temp 98.2 F (36.8 C) (Oral)  Resp 20  SpO2 100%  LMP 12/12/2011  Physical Exam  Nursing note and vitals reviewed. Constitutional: She appears well-developed and well-nourished.  HENT:  Head: Normocephalic and atraumatic.  Mouth/Throat: Oropharynx is clear and moist. No oropharyngeal exudate.    Eyes: Conjunctivae normal and EOM are normal. No scleral icterus.  Neck: Normal range of motion. Neck supple.  Cardiovascular: Normal rate, regular rhythm and normal heart sounds.   Pulmonary/Chest: Effort normal and breath sounds normal.  Abdominal: Soft. Bowel sounds are normal. There is no tenderness.  Lymphadenopathy:    She has no cervical adenopathy.  Neurological: She is alert.  Skin: Skin is warm.    ED Course  Procedures (including critical care time)  Labs Reviewed - No data to display No results found.   1. Pain, dental       MDM  Patient 6 months gestation presented with complaint of dental pain. Given percocet in ED with improvement. Fetal heart tones recorded at bpm 160, by nursing staff. Discharged with ABX, pain medication, and return precautions.        Pixie Casino, PA-C 06/05/12  0044 

## 2012-06-05 MED ORDER — OXYCODONE-ACETAMINOPHEN 5-325 MG PO TABS: 1.0000 | ORAL_TABLET | Freq: Four times a day (QID) | ORAL | Status: DC | PRN

## 2012-06-05 MED ORDER — PENICILLIN V POTASSIUM 500 MG PO TABS: 500.0000 mg | ORAL_TABLET | Freq: Four times a day (QID) | ORAL | Status: DC

## 2012-06-05 NOTE — ED Provider Notes (Signed)
Medical screening examination/treatment/procedure(s) were performed by non-physician practitioner and as supervising physician I was immediately available for consultation/collaboration.   Carleene Cooper III, MD 06/05/12 [phone]

## 2012-07-07 NOTE — L&D Delivery Note (Signed)
After amniotomy the patient rapidly completed the first stage. She had no second stage. She had a precipitous delivery of one live viable black female over a 2nd degree midline tear with bilateral labial tears. Placenta -S/I. EBL-400cc. Baby to NBN. Tear closed with 3-0 Chromic.

## 2012-07-14 ENCOUNTER — Encounter: Payer: Managed Care, Other (non HMO) | Attending: Obstetrics and Gynecology

## 2012-07-14 DIAGNOSIS — O99810 Abnormal glucose complicating pregnancy: Secondary | ICD-10-CM | POA: Insufficient documentation

## 2012-07-14 DIAGNOSIS — Z713 Dietary counseling and surveillance: Secondary | ICD-10-CM | POA: Insufficient documentation

## 2012-07-14 NOTE — Progress Notes (Signed)
  Patient was seen on 07/14/2012 for Gestational Diabetes self-management class at the [facility]. The following learning objectives were met by the patient during this course:   States the definition of Gestational Diabetes  States why dietary management is important in controlling blood glucose  Describes the effects each nutrient has on blood glucose levels  Demonstrates ability to create a balanced meal plan  Demonstrates carbohydrate counting   States when to check blood glucose levels  Demonstrates proper blood glucose monitoring techniques  States the effect of stress and exercise on blood glucose levels  States the importance of limiting caffeine and abstaining from alcohol and smoking  Blood glucose monitor given: Blood glucose monitor given: Contour Next EZ Blood Glucose Kit Lot # [id number] Exp: 05/2013 Blood glucose reading: 94 mg/dl  Patient instructed to monitor glucose levels: FBS: 60 - <90 1 hour: <140 2 hour: <120  *Patient received handouts:  Nutrition Diabetes and Pregnancy  Carbohydrate Counting List  Patient will be seen for follow-up as needed.

## 2012-07-14 NOTE — Patient Instructions (Signed)
Goals:  Check glucose levels per MD as instructed  Follow Gestational Diabetes Diet as instructed  Call for follow-up as needed    

## 2012-08-06 ENCOUNTER — Inpatient Hospital Stay (HOSPITAL_COMMUNITY)
Admission: AD | Admit: 2012-08-06 | Discharge: 2012-08-06 | Disposition: A | Discharge status: Home / Self Care | Payer: Managed Care, Other (non HMO) | Source: Ambulatory Visit | Attending: Obstetrics and Gynecology | Admitting: Obstetrics and Gynecology

## 2012-08-06 ENCOUNTER — Encounter (HOSPITAL_COMMUNITY): Payer: Self-pay | Admitting: Family

## 2012-08-06 DIAGNOSIS — O139 Gestational [pregnancy-induced] hypertension without significant proteinuria, unspecified trimester: Secondary | ICD-10-CM | POA: Insufficient documentation

## 2012-08-06 DIAGNOSIS — O133 Gestational [pregnancy-induced] hypertension without significant proteinuria, third trimester: Secondary | ICD-10-CM | POA: Diagnosis present

## 2012-08-06 LAB — CBC WITH DIFFERENTIAL/PLATELET
Basophils Relative: 0 % (ref 0–1)
HCT: 26.8 % — ABNORMAL LOW (ref 36.0–46.0)
Hemoglobin: 8 g/dL — ABNORMAL LOW (ref 12.0–15.0)
Lymphocytes Relative: 20 % (ref 12–46)
MCHC: 29.9 g/dL — ABNORMAL LOW (ref 30.0–36.0)
Monocytes Relative: 9 % (ref 3–12)
Neutro Abs: 6.7 10*3/uL (ref 1.7–7.7)
WBC: 9.4 10*3/uL (ref 4.0–10.5)

## 2012-08-06 LAB — COMPREHENSIVE METABOLIC PANEL
Alkaline Phosphatase: 148 U/L — ABNORMAL HIGH (ref 39–117)
BUN: 8 mg/dL (ref 6–23)
Chloride: 101 mEq/L (ref 96–112)
GFR calc Af Amer: >90 mL/min (ref >90)
Glucose, Bld: 107 mg/dL — ABNORMAL HIGH (ref 70–99)
Potassium: 3.5 mEq/L (ref 3.5–5.1)
Total Bilirubin: 0.7 mg/dL (ref 0.3–1.2)

## 2012-08-06 LAB — URINALYSIS, ROUTINE W REFLEX MICROSCOPIC
Bilirubin Urine: NEGATIVE
Hgb urine dipstick: NEGATIVE
Protein, ur: NEGATIVE mg/dL
Specific Gravity, Urine: 1.015 (ref 1.005–1.030)
Urobilinogen, UA: 1 mg/dL (ref 0.0–1.0)

## 2012-08-06 LAB — URIC ACID: Uric Acid, Serum: 4.2 mg/dL (ref 2.4–7.0)

## 2012-08-06 NOTE — MAU Provider Note (Signed)
Chief Complaint:  Hypertension   First Provider Initiated Contact with Patient 08/06/12 1232      HPI: April Lucas is a 27 y.o. G2P1001 at 34w 0d who presents to maternity admissions sent from Dr Dareen Piano for elevated BP.  She reports a mild h/a today that is new onset, but denies need for treatment for this.  She reports good fetal movement and denies visual disturbances, epigastric pain, n/v or fever/chills.     Past Medical History: Past Medical History  Diagnosis Date  . Anemia   . Fibroid   . Diabetes mellitus without complication     GDM diet controlled; 2nd pg only    Past obstetric history: OB History    Grav Para Term Preterm Abortions TAB SAB Ect Mult Living   2 1 1       1      # Outc Date GA Lbr Len/2nd Wgt Sex Del Anes PTL Lv   1 TRM 4/07    F SVD EPI No Yes   2 CUR               Past Surgical History: Past Surgical History  Procedure Date  . Extraction of wisdom teeth     Family History: Family History  Problem Relation Age of Onset  . Hypertension Other   . Diabetes Other   . Other Neg Hx   . Hearing loss Neg Hx   . Hypertension Father   . Diabetes Father     Social History: History  Substance Use Topics  . Smoking status: Former Games developer  . Smokeless tobacco: Never Used     Comment: as teenager  . Alcohol Use: No     Comment: not with preg    Allergies: No Known Allergies  Meds:  Prescriptions prior to admission  Medication Sig Dispense Refill  . Prenatal Vit-Fe Fumarate-FA (PRENATAL MULTIVITAMIN) TABS Take 1 tablet by mouth every morning.      . penicillin v potassium (VEETID) 500 MG tablet Take 1 tablet (500 mg total) by mouth 4 (four) times daily.  40 tablet  0    ROS: Pertinent findings in history of present illness.  Physical Exam  Blood pressure 137/98, pulse 115, temperature 97.1 F (36.2 C), temperature source Oral, resp. rate 18, height 5\' 3"  (1.6 m), weight 107.956 kg (238 lb), last menstrual period 12/12/2011. GENERAL:  Well-developed, well-nourished female in no acute distress.  HEENT: normocephalic HEART: normal rate RESP: normal effort ABDOMEN: Soft, non-tender, gravid appropriate for gestational age EXTREMITIES: Nontender, no edema NEURO: alert and oriented SPECULUM EXAM: Deferred    FHT:  Baseline 150 , moderate variability, accelerations present, no decelerations Contractions: occasional, mild to palpation   Labs: Results for orders placed during the hospital encounter of 08/06/12 (from the past 24 hour(s))  URINALYSIS, ROUTINE W REFLEX MICROSCOPIC     Status: Abnormal   Collection Time   08/06/12 11:46 AM      Component Value Range   Color, Urine YELLOW  YELLOW   APPearance HAZY (*) CLEAR   Specific Gravity, Urine 1.015  1.005 - 1.030   pH 6.5  5.0 - 8.0   Glucose, UA NEGATIVE  NEGATIVE mg/dL   Hgb urine dipstick NEGATIVE  NEGATIVE   Bilirubin Urine NEGATIVE  NEGATIVE   Ketones, ur NEGATIVE  NEGATIVE mg/dL   Protein, ur NEGATIVE  NEGATIVE mg/dL   Urobilinogen, UA 1.0  0.0 - 1.0 mg/dL   Nitrite NEGATIVE  NEGATIVE   Leukocytes, UA NEGATIVE  NEGATIVE  CBC WITH DIFFERENTIAL     Status: Abnormal   Collection Time   08/06/12 11:55 AM      Component Value Range   WBC 9.4  4.0 - 10.5 K/uL   RBC 4.37  3.87 - 5.11 MIL/uL   Hemoglobin 8.0 (*) 12.0 - 15.0 g/dL   HCT 40.9 (*) 81.1 - 91.4 %   MCV 61.3 (*) 78.0 - 100.0 fL   MCH 18.3 (*) 26.0 - 34.0 pg   MCHC 29.9 (*) 30.0 - 36.0 g/dL   RDW 78.2 (*) 95.6 - 21.3 %   Platelets 216  150 - 400 K/uL   Neutrophils Relative 71  43 - 77 %   Lymphocytes Relative 20  12 - 46 %   Monocytes Relative 9  3 - 12 %   Eosinophils Relative 0  0 - 5 %   Basophils Relative 0  0 - 1 %   Neutro Abs 6.7  1.7 - 7.7 K/uL   Lymphs Abs 1.9  0.7 - 4.0 K/uL   Monocytes Absolute 0.8  0.1 - 1.0 K/uL   Eosinophils Absolute 0.0  0.0 - 0.7 K/uL   Basophils Absolute 0.0  0.0 - 0.1 K/uL   RBC Morphology POLYCHROMASIA PRESENT    COMPREHENSIVE METABOLIC PANEL     Status:  Abnormal   Collection Time   08/06/12 11:55 AM      Component Value Range   Sodium 133 (*) 135 - 145 mEq/L   Potassium 3.5  3.5 - 5.1 mEq/L   Chloride 101  96 - 112 mEq/L   CO2 21  19 - 32 mEq/L   Glucose, Bld 107 (*) 70 - 99 mg/dL   BUN 8  6 - 23 mg/dL   Creatinine, Ser 0.86  0.50 - 1.10 mg/dL   Calcium 9.3  8.4 - 57.8 mg/dL   Total Protein 6.8  6.0 - 8.3 g/dL   Albumin 2.6 (*) 3.5 - 5.2 g/dL   AST 11  0 - 37 U/L   ALT 6  0 - 35 U/L   Alkaline Phosphatase 148 (*) 39 - 117 U/L   Total Bilirubin 0.7  0.3 - 1.2 mg/dL   GFR calc non Af Amer >90  >90 mL/min   GFR calc Af Amer >90  >90 mL/min  URIC ACID     Status: Normal   Collection Time   08/06/12 11:55 AM      Component Value Range   Uric Acid, Serum 4.2  2.4 - 7.0 mg/dL  LACTATE DEHYDROGENASE     Status: Normal   Collection Time   08/06/12 11:55 AM      Component Value Range   LDH 213  94 - 250 U/L    Assessment: 1. Gestational hypertension w/o significant proteinuria in 3rd trimester     Plan: Discharge home with preeclampsia precautions 24 hour urine to start today, return to lab tomorrow Reviewed known anemia with pt, encouraged her to take prescribed iron, eat iron rich foods F/U in office as scheduled Tuesday Return to MAU as needed    Medication List     As of 08/06/2012 12:33 PM    ASK your doctor about these medications         penicillin v potassium 500 MG tablet   Commonly known as: VEETID   Take 1 tablet (500 mg total) by mouth 4 (four) times daily.      prenatal multivitamin Tabs   Take 1 tablet by mouth  every morning.        Sharen Counter Certified Nurse-Midwife 08/06/2012 12:33 PM

## 2012-08-06 NOTE — MAU Note (Signed)
Patient presents to MAU with c/o elevated BP at doctor's office today; was told to come to MAU for PIH evaluation.  Reports + fetal movement; denies vaginal bleeding, LOF, or contractions.

## 2012-08-17 ENCOUNTER — Inpatient Hospital Stay (HOSPITAL_COMMUNITY)
Admission: AD | Admit: 2012-08-17 | Discharge: 2012-08-17 | Disposition: A | Discharge status: Home / Self Care | Payer: Medicaid Other | Source: Ambulatory Visit | Attending: Obstetrics and Gynecology | Admitting: Obstetrics and Gynecology

## 2012-08-17 ENCOUNTER — Encounter (HOSPITAL_COMMUNITY): Payer: Self-pay | Admitting: *Deleted

## 2012-08-17 ENCOUNTER — Other Ambulatory Visit: Payer: Self-pay | Admitting: Obstetrics and Gynecology

## 2012-08-17 DIAGNOSIS — O99891 Other specified diseases and conditions complicating pregnancy: Secondary | ICD-10-CM | POA: Insufficient documentation

## 2012-08-17 DIAGNOSIS — R109 Unspecified abdominal pain: Secondary | ICD-10-CM | POA: Insufficient documentation

## 2012-08-17 DIAGNOSIS — O4700 False labor before 37 completed weeks of gestation, unspecified trimester: Secondary | ICD-10-CM

## 2012-08-17 MED ORDER — NIFEDIPINE 10 MG PO CAPS
10.0000 mg | ORAL_CAPSULE | Freq: Once | ORAL | Status: AC
  Administered 2012-08-17: 10 mg via ORAL
  Filled 2012-08-17: qty 1

## 2012-08-17 MED ORDER — NIFEDIPINE 10 MG PO CAPS: 10.0000 mg | ORAL_CAPSULE | Freq: Three times a day (TID) | ORAL | Status: DC | PRN

## 2012-08-17 NOTE — MAU Provider Note (Signed)
History     CSN: [id number]  Arrival date and time: 08/17/12 1821   None     Chief Complaint  Patient presents with  . Abdominal Cramping   HPI This is a 27 y.o. female at [redacted]w[redacted]d who presents for cervical check due to contractions. Denies leaking or bleeding and reports + FM.  RN Note: Pt states has had abd cramping since she was checked by Dr. Henderson Cloud. Cervix was 4cm dilated, no prior hx of PTL/PTD.    OB History   Grav Para Term Preterm Abortions TAB SAB Ect Mult Living   2 1 1       1       Past Medical History  Diagnosis Date  . Anemia   . Fibroid   . Diabetes mellitus without complication     GDM diet controlled; 2nd pg only    Past Surgical History  Procedure Laterality Date  . Extraction of wisdom teeth      Family History  Problem Relation Age of Onset  . Hypertension Other   . Diabetes Other   . Other Neg Hx   . Hearing loss Neg Hx   . Hypertension Father   . Diabetes Father     History  Substance Use Topics  . Smoking status: Former Games developer  . Smokeless tobacco: Never Used     Comment: as teenager  . Alcohol Use: No     Comment: not with preg    Allergies: No Known Allergies  Prescriptions prior to admission  Medication Sig Dispense Refill  . penicillin v potassium (VEETID) 500 MG tablet Take 1 tablet (500 mg total) by mouth 4 (four) times daily.  40 tablet  0  . Prenatal Vit-Fe Fumarate-FA (PRENATAL MULTIVITAMIN) TABS Take 1 tablet by mouth every morning.        Review of Systems  Constitutional: Negative for fever and chills.  Gastrointestinal: Positive for abdominal pain. Negative for nausea, vomiting, diarrhea and constipation.       FHR reactive Uterine irritability  Dilation: 3.5 Effacement (%): 80 Cervical Position: Middle Station: -3 Presentation: Vertex Exam by:: Artelia Laroche CNM   Genitourinary: Negative for dysuria.  Musculoskeletal: Negative for myalgias.   Physical Exam   Blood pressure 137/84, pulse 48, temperature  98.3 F (36.8 C), temperature source Oral, resp. rate 18, last menstrual period 12/12/2011.  Physical Exam  Constitutional: She is oriented to person, place, and time. She appears well-developed and well-nourished. No distress.  Cardiovascular: Normal rate.   Respiratory: Effort normal.  GI: Soft. She exhibits no distension. There is no tenderness. There is no rebound and no guarding.  Genitourinary: Uterus normal. Vaginal discharge found.  Musculoskeletal: Normal range of motion.  Neurological: She is alert and oriented to person, place, and time.  Skin: Skin is warm and dry.  Psychiatric: She has a normal mood and affect.   Dilation: 3.5 Effacement (%): 80 Cervical Position: Middle Station: -3 Presentation: Vertex Exam by:: Artelia Laroche CNM Review of Systems  Constitutional: Negative for fever and chills.  Gastrointestinal: Positive for abdominal pain. Negative for nausea, vomiting, diarrhea and constipation.       FHR reactive Uterine irritability  Dilation: 3.5 Effacement (%): 80 Cervical Position: Middle Station: -3 Presentation: Vertex Exam by:: Artelia Laroche CNM   Genitourinary: Negative for dysuria.  Musculoskeletal: Negative for myalgias.   MAU Course  Procedures  MDM Discussed with DR Henderson Cloud. Offered Procardia dose here to slow contractions and Rx for PRN use at home.  Assessment and Plan  A:  Preterm contractions      No significant cervical change  P:  Discharge home .    Labor precautions      Follow up with office    Medication List    TAKE these medications       NIFEdipine 10 MG capsule  Commonly known as:  PROCARDIA  Take 1 capsule (10 mg total) by mouth 3 (three) times daily as needed.     penicillin v potassium 500 MG tablet  Commonly known as:  VEETID  Take 1 tablet (500 mg total) by mouth 4 (four) times daily.     prenatal multivitamin Tabs  Take 1 tablet by mouth every morning.          [facility] 08/17/2012, 7:30 PM

## 2012-08-17 NOTE — MAU Note (Signed)
Pt states has had abd cramping since she was checked by Dr. Henderson Cloud. Cervix was 4cm dilated, no prior hx of PTL/PTD.

## 2012-08-17 NOTE — MAU Note (Signed)
Pt states she started feeling abdominal cramping about 12:30pm after visit to MD office.

## 2012-08-17 NOTE — Discharge Instructions (Signed)
Preventing Preterm Labor  You have a prescription for Procardia that you can use for contractions. You may take one every 8 hours as needed but if your contractions are not frequent or strong, you don't need to take it.   Preterm labor is when a pregnant woman has contractions that cause the cervix to open, shorten, and thin before 37 weeks of pregnancy. You will have regular contractions (tightening) 2 to 3 minutes apart. This usually causes discomfort or pain. HOME CARE  Eat a healthy diet.  Take your vitamins as told by your doctor.  Drink enough fluids to keep your pee (urine) clear or pale yellow every day.  Get rest and sleep.  Do not have sex if you are at high risk for preterm labor.  Follow your doctor's advice about activity, medicines, and tests.  Avoid stress.  Avoid hard labor or exercise that lasts for a long time.  Do not smoke. GET HELP RIGHT AWAY IF:   You are having contractions.  You have belly (abdominal) pain.  You have bleeding from your vagina.  You have pain when you pee (urinate).  You have abnormal discharge from your vagina.  You have a temperature by mouth above 102 F (38.9 C). MAKE SURE YOU:  Understand these instructions.  Will watch your condition.  Will get help if you are not doing well or get worse. Document Released: 09/19/2008 Document Revised: 09/15/2011 Document Reviewed: 09/19/2008 [facility] Patient Information 2013 [place], [place].

## 2012-08-19 ENCOUNTER — Inpatient Hospital Stay (HOSPITAL_COMMUNITY)
Admission: AD | Admit: 2012-08-19 | Discharge: 2012-08-21 | DRG: 774 | Disposition: A | Discharge status: Home / Self Care | Payer: Managed Care, Other (non HMO) | Source: Ambulatory Visit | Attending: Obstetrics and Gynecology | Admitting: Obstetrics and Gynecology

## 2012-08-19 ENCOUNTER — Encounter (HOSPITAL_COMMUNITY): Payer: Self-pay

## 2012-08-19 DIAGNOSIS — O99814 Abnormal glucose complicating childbirth: Secondary | ICD-10-CM | POA: Diagnosis present

## 2012-08-19 DIAGNOSIS — D259 Leiomyoma of uterus, unspecified: Secondary | ICD-10-CM | POA: Diagnosis present

## 2012-08-19 DIAGNOSIS — O34599 Maternal care for other abnormalities of gravid uterus, unspecified trimester: Secondary | ICD-10-CM | POA: Diagnosis present

## 2012-08-19 DIAGNOSIS — D649 Anemia, unspecified: Secondary | ICD-10-CM | POA: Diagnosis not present

## 2012-08-19 DIAGNOSIS — O99214 Obesity complicating childbirth: Secondary | ICD-10-CM | POA: Diagnosis present

## 2012-08-19 DIAGNOSIS — IMO0002 Reserved for concepts with insufficient information to code with codable children: Secondary | ICD-10-CM | POA: Diagnosis present

## 2012-08-19 DIAGNOSIS — D4959 Neoplasm of unspecified behavior of other genitourinary organ: Secondary | ICD-10-CM | POA: Diagnosis present

## 2012-08-19 DIAGNOSIS — E669 Obesity, unspecified: Secondary | ICD-10-CM | POA: Diagnosis present

## 2012-08-19 DIAGNOSIS — O9903 Anemia complicating the puerperium: Secondary | ICD-10-CM | POA: Diagnosis not present

## 2012-08-19 LAB — COMPREHENSIVE METABOLIC PANEL
Albumin: 2.5 g/dL — ABNORMAL LOW (ref 3.5–5.2)
Alkaline Phosphatase: 158 U/L — ABNORMAL HIGH (ref 39–117)
BUN: 4 mg/dL — ABNORMAL LOW (ref 6–23)
CO2: 20 mEq/L (ref 19–32)
Chloride: 103 mEq/L (ref 96–112)
Creatinine, Ser: 0.59 mg/dL (ref 0.50–1.10)
GFR calc Af Amer: >90 mL/min (ref >90)
GFR calc non Af Amer: >90 mL/min (ref >90)
Glucose, Bld: 89 mg/dL (ref 70–99)
Total Bilirubin: 0.9 mg/dL (ref 0.3–1.2)

## 2012-08-19 LAB — CBC
HCT: 26.1 % — ABNORMAL LOW (ref 36.0–46.0)
Hemoglobin: 7.7 g/dL — ABNORMAL LOW (ref 12.0–15.0)
MCH: 17.8 pg — ABNORMAL LOW (ref 26.0–34.0)
MCHC: 29.2 g/dL — ABNORMAL LOW (ref 30.0–36.0)
MCV: 61.1 fL — ABNORMAL LOW (ref 78.0–100.0)
Platelets: 214 10*3/uL (ref 150–400)
RDW: 18.6 % — ABNORMAL HIGH (ref 11.5–15.5)
WBC: 10 10*3/uL (ref 4.0–10.5)

## 2012-08-19 LAB — LACTATE DEHYDROGENASE: LDH: 174 U/L (ref 94–250)

## 2012-08-19 LAB — OB RESULTS CONSOLE GBS: GBS: NEGATIVE

## 2012-08-19 LAB — URINALYSIS, ROUTINE W REFLEX MICROSCOPIC
Glucose, UA: NEGATIVE mg/dL
Protein, ur: 30 mg/dL — AB
pH: 6.5 (ref 5.0–8.0)

## 2012-08-19 LAB — URIC ACID: Uric Acid, Serum: 4.1 mg/dL (ref 2.4–7.0)

## 2012-08-19 MED ORDER — BENZOCAINE-MENTHOL 20-0.5 % EX AERO
1.0000 "application " | INHALATION_SPRAY | CUTANEOUS | Status: DC | PRN
  Administered 2012-08-19: 1 via TOPICAL
  Filled 2012-08-19: qty 56

## 2012-08-19 MED ORDER — MEASLES, MUMPS & RUBELLA VAC ~~LOC~~ INJ: 0.5000 mL | INJECTION | Freq: Once | SUBCUTANEOUS | Status: DC

## 2012-08-19 MED ORDER — IBUPROFEN 600 MG PO TABS
600.0000 mg | ORAL_TABLET | Freq: Four times a day (QID) | ORAL | Status: DC
  Administered 2012-08-20 – 2012-08-21 (×5): 600 mg via ORAL
  Filled 2012-08-19 (×5): qty 1

## 2012-08-19 MED ORDER — CYCLOBENZAPRINE HCL 10 MG PO TABS
10.0000 mg | ORAL_TABLET | Freq: Once | ORAL | Status: AC
  Administered 2012-08-19: 10 mg via ORAL
  Filled 2012-08-19: qty 1

## 2012-08-19 MED ORDER — EPHEDRINE 5 MG/ML INJ
10.0000 mg | INTRAVENOUS | Status: DC | PRN
  Filled 2012-08-19: qty 4

## 2012-08-19 MED ORDER — OXYTOCIN BOLUS FROM INFUSION
500.0000 mL | INTRAVENOUS | Status: DC
  Administered 2012-08-19: 500 mL via INTRAVENOUS

## 2012-08-19 MED ORDER — EPHEDRINE 5 MG/ML INJ: 10.0000 mg | INTRAVENOUS | Status: DC | PRN

## 2012-08-19 MED ORDER — TERBUTALINE SULFATE 1 MG/ML IJ SOLN: 0.2500 mg | Freq: Once | INTRAMUSCULAR | Status: DC | PRN

## 2012-08-19 MED ORDER — ONDANSETRON HCL 4 MG/2ML IJ SOLN: 4.0000 mg | INTRAMUSCULAR | Status: DC | PRN

## 2012-08-19 MED ORDER — LACTATED RINGERS IV SOLN
500.0000 mL | Freq: Once | INTRAVENOUS | Status: AC
  Administered 2012-08-19: 250 mL via INTRAVENOUS

## 2012-08-19 MED ORDER — IBUPROFEN 600 MG PO TABS
600.0000 mg | ORAL_TABLET | Freq: Four times a day (QID) | ORAL | Status: DC | PRN
  Administered 2012-08-19: 600 mg via ORAL
  Filled 2012-08-19: qty 1

## 2012-08-19 MED ORDER — WITCH HAZEL-GLYCERIN EX PADS
1.0000 "application " | MEDICATED_PAD | CUTANEOUS | Status: DC | PRN
  Administered 2012-08-19: 1 via TOPICAL

## 2012-08-19 MED ORDER — GI COCKTAIL ~~LOC~~
30.0000 mL | Freq: Once | ORAL | Status: AC
  Administered 2012-08-19: 30 mL via ORAL
  Filled 2012-08-19: qty 30

## 2012-08-19 MED ORDER — DIBUCAINE 1 % RE OINT
1.0000 "application " | TOPICAL_OINTMENT | RECTAL | Status: DC | PRN
  Administered 2012-08-19: 1 via RECTAL
  Filled 2012-08-19: qty 28

## 2012-08-19 MED ORDER — OXYTOCIN 40 UNITS IN LACTATED RINGERS INFUSION - SIMPLE MED: 62.5000 mL/h | INTRAVENOUS | Status: DC

## 2012-08-19 MED ORDER — PHENYLEPHRINE 40 MCG/ML (10ML) SYRINGE FOR IV PUSH (FOR BLOOD PRESSURE SUPPORT): 80.0000 ug | PREFILLED_SYRINGE | INTRAVENOUS | Status: DC | PRN

## 2012-08-19 MED ORDER — ONDANSETRON HCL 4 MG/2ML IJ SOLN: 4.0000 mg | Freq: Four times a day (QID) | INTRAMUSCULAR | Status: DC | PRN

## 2012-08-19 MED ORDER — FERROUS SULFATE 325 (65 FE) MG PO TABS
325.0000 mg | ORAL_TABLET | Freq: Two times a day (BID) | ORAL | Status: DC
  Administered 2012-08-20 – 2012-08-21 (×3): 325 mg via ORAL
  Filled 2012-08-19 (×3): qty 1

## 2012-08-19 MED ORDER — FLEET ENEMA 7-19 GM/118ML RE ENEM: 1.0000 | ENEMA | RECTAL | Status: DC | PRN

## 2012-08-19 MED ORDER — OXYCODONE-ACETAMINOPHEN 5-325 MG PO TABS: 1.0000 | ORAL_TABLET | ORAL | Status: DC | PRN

## 2012-08-19 MED ORDER — LACTATED RINGERS IV SOLN
INTRAVENOUS | Status: DC
  Administered 2012-08-19 (×2): via INTRAVENOUS

## 2012-08-19 MED ORDER — FENTANYL 2.5 MCG/ML BUPIVACAINE 1/10 % EPIDURAL INFUSION (WH - ANES)
14.0000 mL/h | INTRAMUSCULAR | Status: DC
  Filled 2012-08-19: qty 125

## 2012-08-19 MED ORDER — DIPHENHYDRAMINE HCL 50 MG/ML IJ SOLN: 12.5000 mg | INTRAMUSCULAR | Status: DC | PRN

## 2012-08-19 MED ORDER — LACTATED RINGERS IV SOLN: 500.0000 mL | INTRAVENOUS | Status: DC | PRN

## 2012-08-19 MED ORDER — LIDOCAINE HCL (PF) 1 % IJ SOLN
30.0000 mL | INTRAMUSCULAR | Status: DC | PRN
  Administered 2012-08-19: 30 mL via SUBCUTANEOUS
  Filled 2012-08-19 (×2): qty 30

## 2012-08-19 MED ORDER — TETANUS-DIPHTH-ACELL PERTUSSIS 5-2.5-18.5 LF-MCG/0.5 IM SUSP
0.5000 mL | Freq: Once | INTRAMUSCULAR | Status: AC
  Administered 2012-08-21: 0.5 mL via INTRAMUSCULAR
  Filled 2012-08-19: qty 0.5

## 2012-08-19 MED ORDER — SIMETHICONE 80 MG PO CHEW: 80.0000 mg | CHEWABLE_TABLET | ORAL | Status: DC | PRN

## 2012-08-19 MED ORDER — OXYTOCIN 40 UNITS IN LACTATED RINGERS INFUSION - SIMPLE MED
1.0000 m[IU]/min | INTRAVENOUS | Status: DC
  Administered 2012-08-19: 1 m[IU]/min via INTRAVENOUS
  Filled 2012-08-19: qty 1000

## 2012-08-19 MED ORDER — ZOLPIDEM TARTRATE 5 MG PO TABS: 5.0000 mg | ORAL_TABLET | Freq: Every evening | ORAL | Status: DC | PRN

## 2012-08-19 MED ORDER — PHENYLEPHRINE 40 MCG/ML (10ML) SYRINGE FOR IV PUSH (FOR BLOOD PRESSURE SUPPORT)
80.0000 ug | PREFILLED_SYRINGE | INTRAVENOUS | Status: DC | PRN
  Filled 2012-08-19: qty 5

## 2012-08-19 MED ORDER — OXYCODONE-ACETAMINOPHEN 5-325 MG PO TABS
1.0000 | ORAL_TABLET | ORAL | Status: DC | PRN
  Administered 2012-08-20 (×3): 1 via ORAL
  Filled 2012-08-19 (×3): qty 1

## 2012-08-19 MED ORDER — BUTORPHANOL TARTRATE 1 MG/ML IJ SOLN
1.0000 mg | INTRAMUSCULAR | Status: DC | PRN
  Administered 2012-08-19: 1 mg via INTRAVENOUS
  Filled 2012-08-19: qty 1

## 2012-08-19 MED ORDER — ONDANSETRON HCL 4 MG PO TABS: 4.0000 mg | ORAL_TABLET | ORAL | Status: DC | PRN

## 2012-08-19 MED ORDER — ACETAMINOPHEN 325 MG PO TABS: 650.0000 mg | ORAL_TABLET | ORAL | Status: DC | PRN

## 2012-08-19 MED ORDER — CITRIC ACID-SODIUM CITRATE 334-500 MG/5ML PO SOLN: 30.0000 mL | ORAL | Status: DC | PRN

## 2012-08-19 MED ORDER — PRENATAL MULTIVITAMIN CH
1.0000 | ORAL_TABLET | Freq: Every morning | ORAL | Status: DC
  Administered 2012-08-20 – 2012-08-21 (×2): 1 via ORAL
  Filled 2012-08-19 (×2): qty 1

## 2012-08-19 NOTE — Progress Notes (Signed)
Ivonne Andrew CNM updated on patient status

## 2012-08-19 NOTE — Progress Notes (Signed)
Dr. Thad Ranger called to stand by for Dr, Dareen Piano

## 2012-08-19 NOTE — Progress Notes (Signed)
Dareen Piano, MD, at bedside. Orders received to start pitocin in one hour if patient does not start contracting.

## 2012-08-19 NOTE — MAU Note (Signed)
Patient states she has been having upper abdominal pain for several days. Was seen in the office yesterday and turned in a 24 hour urine and states she would be induced today if values were abnormal.

## 2012-08-19 NOTE — MAU Provider Note (Signed)
Chief Complaint:  Abdominal Pain   First Provider Initiated Contact with Patient 08/19/12 1054     HPI: April Lucas is a 27 y.o. G2P1001 at [redacted]w[redacted]d who presents to maternity admissions reporting RUQ pain x several days. States her doctors have been monitoring her for HTN and plan to induce her if elevated.. Turned in 24 hour urine yesterday. Mild contractions.  Pregnancy Course:   Past Medical History: Past Medical History  Diagnosis Date  . Anemia   . Fibroid   . Diabetes mellitus without complication     GDM diet controlled; 2nd pg only    Past obstetric history: OB History   Grav Para Term Preterm Abortions TAB SAB Ect Mult Living   2 1 1       1      # Outc Date GA Lbr Len/2nd Wgt Sex Del Anes PTL Lv   1 TRM 4/07    F SVD EPI No Yes   2 CUR               Past Surgical History: Past Surgical History  Procedure Laterality Date  . Extraction of wisdom teeth      Family History: Family History  Problem Relation Age of Onset  . Hypertension Other   . Diabetes Other   . Other Neg Hx   . Hearing loss Neg Hx   . Hypertension Father   . Diabetes Father     Social History: History  Substance Use Topics  . Smoking status: Former Games developer  . Smokeless tobacco: Never Used     Comment: as teenager  . Alcohol Use: No     Comment: not with preg    Allergies: No Known Allergies  Meds:  Prescriptions prior to admission  Medication Sig Dispense Refill  . Prenatal Vit-Fe Fumarate-FA (PRENATAL MULTIVITAMIN) TABS Take 1 tablet by mouth every morning.        ROS:  Denies HA, vision changes, leakage of fluid, urinary complaints or vaginal bleeding. Good fetal movement.   Physical Exam  Blood pressure 146/91, pulse 110, temperature 97.5 F (36.4 C), temperature source Oral, resp. rate 20, height 5\' 5"  (1.651 m), weight 113.49 kg (250 lb 3.2 oz), last menstrual period 12/12/2011, SpO2 100.00%. Temp:  [97.5 F (36.4 C)] 97.5 F (36.4 C) (02/13 1022) Pulse Rate:   [82-182] 103 (02/13 1417) Resp:  [18-20] 18 (02/13 1400) BP: (105-149)/(77-102) 123/84 mmHg (02/13 1406) SpO2:  [99 %-100 %] 100 % (02/13 1417) Weight:  [113.49 kg (250 lb 3.2 oz)] 113.49 kg (250 lb 3.2 oz) (02/13 1013)  GENERAL: Well-developed, well-nourished female in mild distress.  HEENT: normocephalic HEART: normal rate RESP: normal effort ABDOMEN: Soft, mild right side tenderness, gravid appropriate for gestational age EXTREMITIES: Nontender, 1+ pedal edema.  NEURO: alert and oriented. DTRs 2+, no clonus.  SPECULUM EXAM: deferred   FHT:  Baseline 140 , moderate variability, accelerations present, no decelerations Contractions: irreg, mild   Labs: Results for orders placed during the hospital encounter of 08/19/12 (from the past 24 hour(s))  URINALYSIS, ROUTINE W REFLEX MICROSCOPIC     Status: Abnormal   Collection Time    08/19/12 10:21 AM      Result Value Range   Color, Urine YELLOW  YELLOW   APPearance CLEAR  CLEAR   Specific Gravity, Urine 1.025  1.005 - 1.030   pH 6.5  5.0 - 8.0   Glucose, UA NEGATIVE  NEGATIVE mg/dL   Hgb urine dipstick TRACE (*) NEGATIVE  Bilirubin Urine NEGATIVE  NEGATIVE   Ketones, ur 15 (*) NEGATIVE mg/dL   Protein, ur 30 (*) NEGATIVE mg/dL   Urobilinogen, UA 2.0 (*) 0.0 - 1.0 mg/dL   Nitrite NEGATIVE  NEGATIVE   Leukocytes, UA NEGATIVE  NEGATIVE  URINE MICROSCOPIC-ADD ON     Status: Abnormal   Collection Time    08/19/12 10:21 AM      Result Value Range   Squamous Epithelial / LPF MANY (*) RARE   WBC, UA 0-2  <3 WBC/hpf   RBC / HPF 0-2  <3 RBC/hpf   Bacteria, UA MANY (*) RARE  CBC     Status: Abnormal   Collection Time    08/19/12 11:00 AM      Result Value Range   WBC 10.0  4.0 - 10.5 K/uL   RBC 4.27  3.87 - 5.11 MIL/uL   Hemoglobin 7.7 (*) 12.0 - 15.0 g/dL   HCT 29.5 (*) 62.1 - 30.8 %   MCV 61.1 (*) 78.0 - 100.0 fL   MCH 18.0 (*) 26.0 - 34.0 pg   MCHC 29.5 (*) 30.0 - 36.0 g/dL   RDW 65.7 (*) 84.6 - 96.2 %   Platelets 214   150 - 400 K/uL  COMPREHENSIVE METABOLIC PANEL     Status: Abnormal   Collection Time    08/19/12 11:00 AM      Result Value Range   Sodium 135  135 - 145 mEq/L   Potassium 3.9  3.5 - 5.1 mEq/L   Chloride 103  96 - 112 mEq/L   CO2 20  19 - 32 mEq/L   Glucose, Bld 89  70 - 99 mg/dL   BUN 4 (*) 6 - 23 mg/dL   Creatinine, Ser 9.52  0.50 - 1.10 mg/dL   Calcium 8.7  8.4 - 84.1 mg/dL   Total Protein 6.0  6.0 - 8.3 g/dL   Albumin 2.5 (*) 3.5 - 5.2 g/dL   AST 10  0 - 37 U/L   ALT 5  0 - 35 U/L   Alkaline Phosphatase 158 (*) 39 - 117 U/L   Total Bilirubin 0.9  0.3 - 1.2 mg/dL   GFR calc non Af Amer >90  >90 mL/min   GFR calc Af Amer >90  >90 mL/min  URIC ACID     Status: None   Collection Time    08/19/12 11:00 AM      Result Value Range   Uric Acid, Serum 4.1  2.4 - 7.0 mg/dL  LACTATE DEHYDROGENASE     Status: None   Collection Time    08/19/12 11:00 AM      Result Value Range   LDH 174  94 - 250 U/L    Imaging:  No results found. MAU Course: 1228: 24 hour Urine results called in by Dr. Jodelle Red. Reviewed labs today. Will give GI cocktail dn Flexeril for pain and D/C home per Dr. Henderson Cloud.  1248: When RN entered room to give meds pt C/O worseing UC's. Will monitor x 1 hour for effectiveness of meds and recheck cervix. Dilation: 5 Effacement (%): 70 Station: -2 Presentation: Vertex Exam by:: Buddy Duty RN 1425: Contractions slightly stronger. RUQ pain decreased to 4/10. Dilation: 6 Effacement (%): 70;80 Station: -2 Presentation: Vertex Exam by:: Buddy Duty RN  Assessment: Early labor Gest HTN  Plan: Admit to BS per Dr. Dareen Piano. GBS PCR collected.   [place], CNM 08/19/2012 11:51 AM

## 2012-08-19 NOTE — H&P (Signed)
Pt is a 27 year old obese black female, G2P1001 at 52 1/2 weeks who presented to the ER c/o RUQ pain. She was evaluated for preeclampsia and all labs were wnl. While there she was complaining of pelvic pressure. The nursing service stated that she changed her cervix from 4 to 6 cm. She was admitted for labor. PNC was complicated by GDM, well controlled with diet. PMHx: see [facility] PE: HEENT- wnl        Abd- gravid, non tender, no masses        Exts-  wnl        FHTs- reactive IMP/ IUP at term in labor         Preeclampsia         GDM          Obesity         Fibroids PLAN/ Admit

## 2012-08-19 NOTE — Progress Notes (Signed)
Dareen Piano, MD, notified of tachysystole. Orders received to turn off pitocin at this time. RN may restart pitocin at 1 milliunit if contractions space out.

## 2012-08-20 LAB — CBC
MCH: 17.9 pg — ABNORMAL LOW (ref 26.0–34.0)
Platelets: 219 10*3/uL (ref 150–400)
RBC: 3.86 MIL/uL — ABNORMAL LOW (ref 3.87–5.11)
WBC: 13.3 10*3/uL — ABNORMAL HIGH (ref 4.0–10.5)

## 2012-08-20 LAB — RPR: RPR Ser Ql: NONREACTIVE

## 2012-08-20 NOTE — Progress Notes (Signed)
Patient is eating, ambulating, voiding.  Pain control is good.  Filed Vitals:   08/19/12 2106 08/19/12 2131 08/19/12 2252 08/20/12 0303  BP: 141/91 143/93 134/85 119/80  Pulse: 88 84 103 105  Temp:  98.2 F (36.8 C) 99.2 F (37.3 C) 98.5 F (36.9 C)  TempSrc:  Oral Oral Oral  Resp:  16 16 16   Height:      Weight:      SpO2:        Fundus firm Perineum without swelling.  Lab Results  Component Value Date   WBC 10.2 08/19/2012   HGB 7.3* 08/19/2012   HCT 25.0* 08/19/2012   MCV 61.1* 08/19/2012   PLT 214 08/19/2012    --/--/O POS (02/13 1100)/RI  A/P Post partum day 1.  Routine care.  Expect d/c tomorrow.   BPS stable but pulse slightly elevated and has low H/H.  Anemia precautions and iron. April Lucas A

## 2012-08-20 NOTE — Discharge Summary (Signed)
Obstetric Discharge Summary Reason for Admission: onset of labor Prenatal Procedures: Preeclampsia Intrapartum Procedures: spontaneous vaginal delivery Postpartum Procedures: none Complications-Operative and Postpartum: 2 degree perineal laceration Hemoglobin  Date Value Range Status  08/19/2012 7.3* 12.0 - 15.0 g/dL Final     HCT  Date Value Range Status  08/19/2012 25.0* 36.0 - 46.0 % Final     Discharge Diagnoses: Term Pregnancy-delivered and Preelampsia  Discharge Information: Date: 08/20/2012 Activity: pelvic rest Diet: routine Medications: Ibuprofen and Iron Condition: stable Instructions: refer to practice specific booklet Discharge to: home Follow-up Information   Follow up with Levi Aland, MD. Schedule an appointment as soon as possible for a visit in 4 weeks.   Contact information:   [address] [place] [address] [phone]       Newborn Data: Live born female  Birth Weight: 6 lb 14.6 oz (3135 g) APGAR: 8, 9  Home with mother.  Theresa Dohrman A 08/20/2012, 7:45 AM

## 2012-08-21 NOTE — Progress Notes (Signed)
Patient is eating, ambulating, voiding.  Pain control is good.  Filed Vitals:   08/19/12 2252 08/20/12 0303 08/20/12 1755 08/21/12 0619  BP: 134/85 119/80 145/90 123/86  Pulse: 103 105 94 98  Temp: 99.2 F (37.3 C) 98.5 F (36.9 C) 98.8 F (37.1 C) 98.2 F (36.8 C)  TempSrc: Oral Oral Oral Oral  Resp: 16 16 18 18   Height:      Weight:      SpO2:        Fundus firm Perineum without swelling.  Lab Results  Component Value Date   WBC 13.3* 08/20/2012   HGB 6.9* 08/20/2012   HCT 23.7* 08/20/2012   MCV 61.4* 08/20/2012   PLT 219 08/20/2012    --/--/O POS (02/13 1100)/RI  A/P Post partum day 2.  Routine care.  Expect d/c today.   Iron for anemia at home. Joron Velis A

## 2012-08-23 LAB — TYPE AND SCREEN
ABO/RH(D): O POS
Antibody Screen: NEGATIVE
Unit division: 0
Unit division: 0

## 2012-08-25 NOTE — Progress Notes (Signed)
Post discharge chart review completed.  

## 2013-05-20 ENCOUNTER — Emergency Department (INDEPENDENT_AMBULATORY_CARE_PROVIDER_SITE_OTHER)
Admission: EM | Admit: 2013-05-20 | Discharge: 2013-05-20 | Disposition: A | Discharge status: Home / Self Care | Payer: Managed Care, Other (non HMO) | Source: Home / Self Care | Attending: Family Medicine | Admitting: Family Medicine

## 2013-05-20 ENCOUNTER — Encounter (HOSPITAL_COMMUNITY): Payer: Self-pay | Admitting: Emergency Medicine

## 2013-05-20 DIAGNOSIS — K047 Periapical abscess without sinus: Secondary | ICD-10-CM

## 2013-05-20 MED ORDER — DICLOFENAC POTASSIUM 50 MG PO TABS: 50.0000 mg | ORAL_TABLET | Freq: Three times a day (TID) | ORAL | Status: DC

## 2013-05-20 MED ORDER — AMOXICILLIN 500 MG PO CAPS: 500.0000 mg | ORAL_CAPSULE | Freq: Three times a day (TID) | ORAL | Status: DC

## 2013-05-20 NOTE — ED Provider Notes (Signed)
CSN: [id number]     Arrival date & time 05/20/13  1146 History   First MD Initiated Contact with Patient 05/20/13 1340     Chief Complaint  Patient presents with  . Dental Pain   (Consider location/radiation/quality/duration/timing/severity/associated sxs/prior Treatment) Patient is a 27 y.o. female presenting with tooth pain. The history is provided by the patient.  Dental Pain Location:  Lower Lower teeth location:  17/LL 3rd molar and 18/LL 2nd molar Quality:  Throbbing and constant Severity:  Moderate Onset quality:  Gradual Duration:  2 days Timing:  Constant Progression:  Worsening Chronicity:  Recurrent Context: dental caries, dental fracture and poor dentition   Ineffective treatments:  NSAIDs and acetaminophen Associated symptoms: facial pain and gum swelling   Associated symptoms: no facial swelling and no fever   Risk factors: lack of dental care     Past Medical History  Diagnosis Date  . Anemia   . Fibroid   . Diabetes mellitus without complication     GDM diet controlled; 2nd pg only   Past Surgical History  Procedure Laterality Date  . Extraction of wisdom teeth     Family History  Problem Relation Age of Onset  . Hypertension Other   . Diabetes Other   . Other Neg Hx   . Hearing loss Neg Hx   . Hypertension Father   . Diabetes Father    History  Substance Use Topics  . Smoking status: Former Games developer  . Smokeless tobacco: Never Used     Comment: as teenager  . Alcohol Use: No     Comment: not with preg   OB History   Grav Para Term Preterm Abortions TAB SAB Ect Mult Living   2 2 1 1      2      Review of Systems  Constitutional: Negative.  Negative for fever.  HENT: Positive for dental problem. Negative for facial swelling.     Allergies  Review of patient's allergies indicates no known allergies.  Home Medications   Current Outpatient Rx  Name  Route  Sig  Dispense  Refill  . amoxicillin (AMOXIL) 500 MG capsule   Oral   Take 1  capsule (500 mg total) by mouth 3 (three) times daily.   30 capsule   0   . diclofenac (CATAFLAM) 50 MG tablet   Oral   Take 1 tablet (50 mg total) by mouth 3 (three) times daily.   21 tablet   0   . Prenatal Vit-Fe Fumarate-FA (PRENATAL MULTIVITAMIN) TABS   Oral   Take 1 tablet by mouth every morning.          BP 122/78  Pulse 74  Temp(Src) 98.7 F (37.1 C) (Oral)  Resp 16  SpO2 100% Physical Exam  Nursing note and vitals reviewed. Constitutional: She is oriented to person, place, and time. She appears well-developed and well-nourished.  HENT:  Right Ear: External ear normal.  Left Ear: External ear normal.  Mouth/Throat: Oropharynx is clear and moist. Abnormal dentition. Dental abscesses and dental caries present.    Neck: Normal range of motion. Neck supple.  Lymphadenopathy:    She has no cervical adenopathy.  Neurological: She is alert and oriented to person, place, and time.  Skin: Skin is warm and dry.    ED Course  Procedures (including critical care time) Labs Review Labs Reviewed - No data to display Imaging Review No results found.  EKG Interpretation     Ventricular Rate:  PR Interval:    QRS Duration:   QT Interval:    QTC Calculation:   R Axis:     Text Interpretation:              MDM      Linna Hoff, MD 05/20/13 1401

## 2013-05-20 NOTE — ED Notes (Signed)
C/o 2 day duration toothache; NAD, no relief w OTC medication

## 2013-10-28 ENCOUNTER — Emergency Department (HOSPITAL_COMMUNITY)
Admission: EM | Admit: 2013-10-28 | Discharge: 2013-10-29 | Disposition: A | Discharge status: Home / Self Care | Payer: Managed Care, Other (non HMO) | Attending: Emergency Medicine | Admitting: Emergency Medicine

## 2013-10-28 ENCOUNTER — Encounter (HOSPITAL_COMMUNITY): Payer: Self-pay | Admitting: Emergency Medicine

## 2013-10-28 DIAGNOSIS — Z8659 Personal history of other mental and behavioral disorders: Secondary | ICD-10-CM | POA: Diagnosis not present

## 2013-10-28 DIAGNOSIS — R5381 Other malaise: Secondary | ICD-10-CM | POA: Diagnosis present

## 2013-10-28 DIAGNOSIS — Z3202 Encounter for pregnancy test, result negative: Secondary | ICD-10-CM | POA: Diagnosis not present

## 2013-10-28 DIAGNOSIS — Z8632 Personal history of gestational diabetes: Secondary | ICD-10-CM | POA: Diagnosis not present

## 2013-10-28 DIAGNOSIS — R5383 Other fatigue: Secondary | ICD-10-CM | POA: Diagnosis present

## 2013-10-28 DIAGNOSIS — R55 Syncope and collapse: Secondary | ICD-10-CM | POA: Diagnosis not present

## 2013-10-28 DIAGNOSIS — Z87891 Personal history of nicotine dependence: Secondary | ICD-10-CM | POA: Insufficient documentation

## 2013-10-28 DIAGNOSIS — E669 Obesity, unspecified: Secondary | ICD-10-CM | POA: Insufficient documentation

## 2013-10-28 DIAGNOSIS — I10 Essential (primary) hypertension: Secondary | ICD-10-CM | POA: Insufficient documentation

## 2013-10-28 DIAGNOSIS — Z862 Personal history of diseases of the blood and blood-forming organs and certain disorders involving the immune mechanism: Secondary | ICD-10-CM | POA: Diagnosis not present

## 2013-10-28 DIAGNOSIS — R42 Dizziness and giddiness: Secondary | ICD-10-CM | POA: Diagnosis not present

## 2013-10-28 HISTORY — DX: Obesity, unspecified: E66.9

## 2013-10-28 HISTORY — DX: Essential (primary) hypertension: I10

## 2013-10-28 HISTORY — DX: Anxiety disorder, unspecified: F41.9

## 2013-10-28 LAB — URINALYSIS, ROUTINE W REFLEX MICROSCOPIC
BILIRUBIN URINE: NEGATIVE
GLUCOSE, UA: NEGATIVE mg/dL
HGB URINE DIPSTICK: NEGATIVE
Ketones, ur: NEGATIVE mg/dL
Leukocytes, UA: NEGATIVE
Nitrite: NEGATIVE
PROTEIN: NEGATIVE mg/dL
Specific Gravity, Urine: 1.024 (ref 1.005–1.030)
UROBILINOGEN UA: 1 mg/dL (ref 0.0–1.0)
pH: 7 (ref 5.0–8.0)

## 2013-10-28 LAB — COMPREHENSIVE METABOLIC PANEL
ALK PHOS: 99 U/L (ref 39–117)
ALT: 8 U/L (ref 0–35)
AST: 13 U/L (ref 0–37)
Albumin: 3.6 g/dL (ref 3.5–5.2)
BILIRUBIN TOTAL: 0.7 mg/dL (ref 0.3–1.2)
BUN: 8 mg/dL (ref 6–23)
CHLORIDE: 107 meq/L (ref 96–112)
CO2: 24 meq/L (ref 19–32)
CREATININE: 0.59 mg/dL (ref 0.50–1.10)
Calcium: 8.9 mg/dL (ref 8.4–10.5)
GLUCOSE: 81 mg/dL (ref 70–99)
POTASSIUM: 3.4 meq/L — AB (ref 3.7–5.3)
Sodium: 144 mEq/L (ref 137–147)
Total Protein: 7.2 g/dL (ref 6.0–8.3)

## 2013-10-28 LAB — CBC WITH DIFFERENTIAL/PLATELET
BASOS PCT: 0 % (ref 0–1)
Basophils Absolute: 0 10*3/uL (ref 0.0–0.1)
Eosinophils Absolute: 0.1 10*3/uL (ref 0.0–0.7)
Eosinophils Relative: 1 % (ref 0–5)
HEMATOCRIT: 34.9 % — AB (ref 36.0–46.0)
HEMOGLOBIN: 11.1 g/dL — AB (ref 12.0–15.0)
LYMPHS ABS: 2.4 10*3/uL (ref 0.7–4.0)
LYMPHS PCT: 32 % (ref 12–46)
MCH: 22.9 pg — ABNORMAL LOW (ref 26.0–34.0)
MCHC: 31.8 g/dL (ref 30.0–36.0)
MCV: 72.1 fL — AB (ref 78.0–100.0)
MONOS PCT: 7 % (ref 3–12)
Monocytes Absolute: 0.5 10*3/uL (ref 0.1–1.0)
NEUTROS ABS: 4.4 10*3/uL (ref 1.7–7.7)
Neutrophils Relative %: 60 % (ref 43–77)
Platelets: 323 10*3/uL (ref 150–400)
RBC: 4.84 MIL/uL (ref 3.87–5.11)
RDW: 15 % (ref 11.5–15.5)
WBC: 7.4 10*3/uL (ref 4.0–10.5)

## 2013-10-28 LAB — PREGNANCY, URINE: PREG TEST UR: NEGATIVE

## 2013-10-28 NOTE — ED Provider Notes (Signed)
CSN: [id number]     Arrival date & time 10/28/13  1916 History   First MD Initiated Contact with Patient 10/28/13 2244     Chief Complaint  Patient presents with  . Weakness     (Consider location/radiation/quality/duration/timing/severity/associated sxs/prior Treatment) HPI Comments: Patient with past history of anemia, gestational diabetes -- presents with complaint of fatigue for 5 days. She has also had 2 near syncopal episodes. The first was 2 days ago and the second was today. Patient states that she felt very lightheaded and was shaking. She did not have full syncope. She denied chest pain, shortness of breath, or palpitations. Otherwise she denies fevers, chest pain, shortness of breath, cough, nausea, vomiting, diarrhea, abdominal pain, dysuria, hematuria, vaginal bleeding. No treatments prior to arrival. No FH of sudden cardiac death at a young age. Onset of symptoms gradual. Course is constant. Nothing makes symptoms better or worse.  Patient is a 28 y.o. female presenting with weakness. The history is provided by the patient.  Weakness Associated symptoms include weakness. Pertinent negatives include no abdominal pain, chest pain, coughing, fever, headaches, myalgias, nausea, numbness, rash, sore throat or vomiting.    Past Medical History  Diagnosis Date  . Anemia   . Fibroid   . Diabetes mellitus without complication     GDM diet controlled; 2nd pg only  . Obesity   . Anxiety   . Hypertension    Past Surgical History  Procedure Laterality Date  . Extraction of wisdom teeth     Family History  Problem Relation Age of Onset  . Hypertension Other   . Diabetes Other   . Other Neg Hx   . Hearing loss Neg Hx   . Hypertension Father   . Diabetes Father    History  Substance Use Topics  . Smoking status: Former Research scientist (life sciences)  . Smokeless tobacco: Never Used     Comment: as teenager  . Alcohol Use: No     Comment: not with preg   OB History   Grav Para Term Preterm  Abortions TAB SAB Ect Mult Living   2 2 1 1      2      Review of Systems  Constitutional: Negative for fever.  HENT: Negative for rhinorrhea and sore throat.   Eyes: Negative for redness.  Respiratory: Negative for cough and shortness of breath.   Cardiovascular: Negative for chest pain.  Gastrointestinal: Negative for nausea, vomiting, abdominal pain and diarrhea.  Genitourinary: Negative for dysuria.  Musculoskeletal: Negative for myalgias.  Skin: Negative for rash.  Neurological: Positive for weakness and light-headedness. Negative for syncope, numbness and headaches.    Allergies  Review of patient's allergies indicates no known allergies.  Home Medications   Prior to Admission medications   Medication Sig Start Date End Date Taking? Authorizing Provider  levonorgestrel (MIRENA) 20 MCG/24HR IUD 1 each by Intrauterine route once.   Yes Historical Provider, MD   BP 117/68  Pulse 83  Temp(Src) 98 F (36.7 C) (Oral)  Resp 16  SpO2 100%  LMP 10/13/2013  Physical Exam  Nursing note and vitals reviewed. Constitutional: She appears well-developed and well-nourished.  HENT:  Head: Normocephalic and atraumatic.  Right Ear: External ear normal.  Left Ear: External ear normal.  Nose: Nose normal.  Mouth/Throat: Oropharynx is clear and moist.  Eyes: Conjunctivae are normal. Right eye exhibits no discharge. Left eye exhibits no discharge.  Conjunctiva not pale  Neck: Normal range of motion. Neck supple. No JVD present. No  thyromegaly present.  Cardiovascular: Normal rate, regular rhythm and normal heart sounds.   No murmur heard. Pulmonary/Chest: Effort normal and breath sounds normal. No respiratory distress. She has no wheezes. She has no rales.  Abdominal: Soft. Bowel sounds are normal. There is no tenderness. There is no rebound and no guarding.  Musculoskeletal: She exhibits no edema and no tenderness.  Neurological: She is alert.  Skin: Skin is warm and dry.   Psychiatric: She has a normal mood and affect.    ED Course  Procedures (including critical care time) Labs Review Labs Reviewed  CBC WITH DIFFERENTIAL - Abnormal; Notable for the following:    Hemoglobin 11.1 (*)    HCT 34.9 (*)    MCV 72.1 (*)    MCH 22.9 (*)    All other components within normal limits  COMPREHENSIVE METABOLIC PANEL - Abnormal; Notable for the following:    Potassium 3.4 (*)    All other components within normal limits  URINALYSIS, ROUTINE W REFLEX MICROSCOPIC  PREGNANCY, URINE    Imaging Review No results found.   EKG Interpretation None       Date: 10/29/2013  Rate: 76  Rhythm: normal sinus rhythm  QRS Axis: normal  Intervals: normal  ST/T Wave abnormalities: normal  Conduction Disutrbances:none  Narrative Interpretation: no prolonged Qt, no signs of WPW, no Brugada syndrome.   Old EKG Reviewed: none available    11:26 PM Patient seen and examined. Work-up initiated.   Vital signs reviewed and are as follows: Filed Vitals:   10/28/13 2301  BP: 117/68  Pulse: 83  Temp: 98 F (36.7 C)  Resp: 16   12:02 AM Pt is not orthostatic. She feels well. Labs reassuring, she was informed of results.    Encouraged to rest and hydrate well this weekend.   Encouraged to followup with primary care physician in the next week for reevaluation and evaluation of other causes of fatigue.  Patient urged to return with worsening symptoms, full syncope, other concerns.   MDM   Final diagnoses:  Fatigue  Near syncope   Fatigue: Workup reassuring, no obvious cause of symptoms. History of significant anemia, mild anemia today. Normal blood sugar.   Near-syncope: Normal EKG. No family history of sudden cardiac death at young age. Lightheadedness not induced by or occurring after exercise. She does not have chest pain, palpitations, shortness of breath. Doubt cardiogenic etiology. Feel patient is safe for discharge home with PCP followup. UPT negative.  Normal vital signs.  No dangerous or life-threatening conditions suspected or identified by history, physical exam, and by work-up. No indications for hospitalization identified.      Carlisle Cater, PA-C 10/29/13 0006

## 2013-10-28 NOTE — Discharge Instructions (Signed)
Please read and follow all provided instructions.  Your diagnoses today include:  1. Fatigue   2. Near syncope     Tests performed today include:  Blood counts and electrolytes - slightly low red blood cell count and slightly low potassium  EKG - normal  Urine test - normal, no pregnancy  Vital signs. See below for your results today.   Medications prescribed:   None  Take any prescribed medications only as directed.  Home care instructions:  Follow any educational materials contained in this packet.  Follow-up instructions: Please follow-up with your primary care provider in the next 3 days for further evaluation of your symptoms. If you do not have a primary care doctor -- see below for referral information.   Return instructions:   Please return to the Emergency Department if you experience worsening symptoms.   Return with any episodes where you pass completely out.   Please return if you have any other emergent concerns.  Additional Information:  Your vital signs today were: BP 127/82   Pulse 81   Temp(Src) 98 F (36.7 C) (Oral)   Resp 16   SpO2 100%   LMP 10/13/2013 If your blood pressure (BP) was elevated above 135/85 this visit, please have this repeated by your doctor within one month. --------------

## 2013-10-28 NOTE — ED Notes (Signed)
Pt. reports generalized weakness , " shaking " ,with near syncopal episode last Sunday while at church and again today . Alert and oriented / respirations unlabored.

## 2013-10-31 NOTE — ED Provider Notes (Signed)
Medical screening examination/treatment/procedure(s) were performed by non-physician practitioner and as supervising physician I was immediately available for consultation/collaboration.   EKG Interpretation   Date/Time:  Friday October 28 2013 23:36:16 EDT Ventricular Rate:  76 PR Interval:  147 QRS Duration: 68 QT Interval:  365 QTC Calculation: 410 R Axis:   55 Text Interpretation:  Sinus rhythm Low voltage, precordial leads When  compared with ECG of 06/23/2009, No significant change was found Confirmed  by [facility]  MD, DAVID ([id number]) on 10/29/2013 12:14:03 AM        Ephraim Hamburger, MD 10/31/13 [phone]

## 2013-11-01 ENCOUNTER — Institutional Professional Consult (permissible substitution): Payer: Self-pay | Admitting: Medical

## 2014-05-08 ENCOUNTER — Encounter (HOSPITAL_COMMUNITY): Payer: Self-pay | Admitting: Emergency Medicine

## 2014-08-30 ENCOUNTER — Telehealth: Payer: Self-pay | Admitting: Medical

## 2014-08-30 ENCOUNTER — Ambulatory Visit (INDEPENDENT_AMBULATORY_CARE_PROVIDER_SITE_OTHER): Payer: Managed Care, Other (non HMO) | Admitting: Medical

## 2014-08-30 VITALS — BP 112/80 | HR 82 | Temp 98.4°F | Resp 16 | Wt 203.0 lb

## 2014-08-30 DIAGNOSIS — D1720 Benign lipomatous neoplasm of skin and subcutaneous tissue of unspecified limb: Secondary | ICD-10-CM

## 2014-08-30 DIAGNOSIS — E669 Obesity, unspecified: Secondary | ICD-10-CM

## 2014-08-30 NOTE — Telephone Encounter (Signed)
Refer to general surgery.

## 2014-08-30 NOTE — Progress Notes (Signed)
Subjective: Here as a new patient today accompanied by her husband and 28-year-old daughter. Her main concern today is a growing lump in her right axilla. She reports having a lump there at age 54 years old had been seen for this years ago but recently this lump has gotten much bigger, more obvious, very embarrassing. Her and husband are both working on losing weight, she is lost over 27 pounds since the in the last year. Otherwise in normal state of health. No other new complaint  Objective: Gen: wd, wn, nad obese AA female Right axilla anteriorly with a 5 cm diameter fatty fleshy raised lump suggestive of lipoma, similar but much smaller lump in the left axilla Heart: RRR, normal S1-S2 no murmurs Lungs clear Pulses normal   Assessment: Encounter Diagnoses  Name Primary?  . Lipoma of axilla Yes  . Obesity    Plan: Refer to general surgery for consult to excise the lipoma in her right axilla.  Obesity-congratulated her on her efforts so far but continue efforts at weight loss through healthy diet and exercise  Follow up soon at her convenience for a baseline physical

## 2014-08-31 NOTE — Telephone Encounter (Signed)
Faxed to [facility] at [employer]

## 2014-09-08 ENCOUNTER — Telehealth: Payer: Self-pay | Admitting: Family Medicine

## 2014-09-08 NOTE — Telephone Encounter (Signed)
PATIENT HAS AN APPOINTMENT AT [facility] ON 09/18/2014 @ 940 AM WITH DR. Brantley Stage. PATIENT IS AWARE OF HER APPOINTMENT. [phone]

## 2014-11-06 ENCOUNTER — Ambulatory Visit: Payer: Self-pay | Admitting: Surgery

## 2014-11-06 NOTE — H&P (Signed)
April Lucas 11/06/2014 11:28 AM Location: [facility] Patient #: [id number] DOB: 1985-09-06 Married / Language: English / Race: Black or African American Female History of Present Illness April Lucas A. April Lingard MD; 11/06/2014 11:52 AM) Patient words: lipoma rt axilla  pt sent at the request of April Bode PA for right axilla lipoma. It has been present for 16 years and slowly growing. It causes irritation and pt desires excision. No bleeding or drainage.  The patient is a 29 year old female   Other Problems April Lucas, April Lucas; 11/06/2014 11:28 AM) Back Pain Hemorrhoids  Past Surgical History April Lucas, [place]; 11/06/2014 11:28 AM) Oral Surgery  Diagnostic Studies History April Lucas, [place]; 11/06/2014 11:28 AM) Colonoscopy never Mammogram never Pap Smear 1-5 years ago  Allergies April Lucas, CMA; 11/06/2014 11:29 AM) No Known Drug Allergies 11/06/2014  Medication History April Lucas, CMA; 11/06/2014 11:29 AM) April Lucas (20MCG/24HR IUD, Intrauterine) Active. Medications Reconciled  Social History April Lucas, [place]; 11/06/2014 11:28 AM) Alcohol use Occasional alcohol use. Caffeine use Tea. No drug use Tobacco use Former smoker.  Family History April Lucas, [place]; 11/06/2014 11:28 AM) Diabetes Mellitus Father, Sister. Hypertension Father.  Pregnancy / Birth History April Lucas, CMA; 11/06/2014 11:28 AM) Age at menarche 53 years. Contraceptive History Intrauterine device. Gravida 2 Maternal age 66-20 Para 2     Review of Systems April Lucas CMA; 11/06/2014 11:28 AM) General Not Present- Appetite Loss, Chills, Fatigue, Fever, Night Sweats, Weight Gain and Weight Loss. Skin Not Present- Change in Wart/Mole, Dryness, Hives, Jaundice, New Lesions, Non-Healing Wounds, Rash and Ulcer. HEENT Not Present- Earache, Hearing Loss, Hoarseness, Nose Bleed, Oral Ulcers, Ringing in the Ears, Seasonal Allergies, Sinus Pain, Sore Throat, Visual Disturbances, Wears  glasses/contact lenses and Yellow Eyes. Respiratory Not Present- Bloody sputum, Chronic Cough, Difficulty Breathing, Snoring and Wheezing. Breast Not Present- Breast Mass, Breast Pain, Nipple Discharge and Skin Changes. Cardiovascular Not Present- Chest Pain, Difficulty Breathing Lying Down, Leg Cramps, Palpitations, Rapid Heart Rate, Shortness of Breath and Swelling of Extremities. Female Genitourinary Not Present- Frequency, Nocturia, Painful Urination, Pelvic Pain and Urgency. Musculoskeletal Not Present- Back Pain, Joint Pain, Joint Stiffness, Muscle Pain, Muscle Weakness and Swelling of Extremities. Neurological Not Present- Decreased Memory, Fainting, Headaches, Numbness, Seizures, Tingling, Tremor, Trouble walking and Weakness. Psychiatric Not Present- Anxiety, Bipolar, Change in Sleep Pattern, Depression, Fearful and Frequent crying. Endocrine Not Present- Cold Intolerance, Excessive Hunger, Hair Changes, Heat Intolerance, Hot flashes and New Diabetes. Hematology Not Present- Easy Bruising, Excessive bleeding, Gland problems, HIV and Persistent Infections.  Vitals April Lucas CMA; 11/06/2014 11:29 AM) 11/06/2014 11:29 AM Weight: 210 lb Height: 63in Body Surface Area: 2.06 m Body Mass Index: 37.2 kg/m Temp.: 98.73F(Oral)  Pulse: 74 (Regular)  Resp.: 17 (Unlabored)  BP: 128/78 (Sitting, Left Arm, Standard)     Physical Exam (April Lucas A. April Bascomb MD; 11/06/2014 11:53 AM)  General Mental Status-Alert. General Appearance-Consistent with stated age. Hydration-Well hydrated. Voice-Normal.  Integumentary Note: 5 cm right axillary lipoma mobile not fixed   Head and Neck Head-normocephalic, atraumatic with no lesions or palpable masses. Trachea-midline. Thyroid Gland Characteristics - normal size and consistency.  Eye Eyeball - Bilateral-Extraocular movements intact. Sclera/Conjunctiva - Bilateral-No scleral icterus.  Chest and Lung Exam Chest and  lung exam reveals -quiet, even and easy respiratory effort with no use of accessory muscles and on auscultation, normal breath sounds, no adventitious sounds and normal vocal resonance. Inspection Chest Wall - Normal. Back - normal.  Cardiovascular Cardiovascular examination reveals -normal heart sounds, regular rate and rhythm  with no murmurs and normal pedal pulses bilaterally.  Neurologic Neurologic evaluation reveals -alert and oriented x 3 with no impairment of recent or remote memory. Mental Status-Normal.  Musculoskeletal Normal Exam - Left-Upper Extremity Strength Normal and Lower Extremity Strength Normal. Normal Exam - Right-Upper Extremity Strength Normal and Lower Extremity Strength Normal.  Lymphatic Axillary  General Axillary Region: Bilateral - Description - Normal. Tenderness - Non Tender.    Assessment & Plan (April Canul A. 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 risk of bleeding, infection, nerve injury, recurrence wound problems. pt agrees to proceed.  Current Plans Schedule for Surgery Written instructions provided The anatomy and the physiology was discussed. The pathophysiology and natural history of the disease was discussed. Options were discussed and recommendations were made. Technique, risks, benefits, & alternatives were discussed. Risks such as stroke, heart attack, bleeding, indection, death, and other risks discussed. Questions answered. The patient agrees to proceed. Pt Education - [facility] Free Text Education/Instructions: discussed with patient and provided information.

## 2014-12-27 ENCOUNTER — Ambulatory Visit (INDEPENDENT_AMBULATORY_CARE_PROVIDER_SITE_OTHER): Payer: Managed Care, Other (non HMO) | Admitting: Family Medicine

## 2014-12-27 VITALS — BP 110/72 | HR 92 | Temp 98.8°F | Resp 16 | Ht 64.0 in | Wt 208.4 lb

## 2014-12-27 DIAGNOSIS — K088 Other specified disorders of teeth and supporting structures: Secondary | ICD-10-CM

## 2014-12-27 DIAGNOSIS — K047 Periapical abscess without sinus: Secondary | ICD-10-CM | POA: Diagnosis not present

## 2014-12-27 DIAGNOSIS — K0889 Other specified disorders of teeth and supporting structures: Secondary | ICD-10-CM

## 2014-12-27 MED ORDER — AMOXICILLIN 875 MG PO TABS: 875.0000 mg | ORAL_TABLET | Freq: Two times a day (BID) | ORAL | Status: DC

## 2014-12-27 MED ORDER — TRAMADOL HCL 50 MG PO TABS: 50.0000 mg | ORAL_TABLET | Freq: Three times a day (TID) | ORAL | Status: DC | PRN

## 2014-12-27 NOTE — Patient Instructions (Signed)
Take the amoxicillin one twice daily for infection  Take the tramadol 50 mg 1 every 8 hours as needed for pain. For increased pain relief you can take 2 Tylenol with the tramadol if needed. The interaction between Tylenol and tramadol may get a stronger medicine.  Contact your dentist and arrange for further care

## 2014-12-27 NOTE — Progress Notes (Signed)
  Subjective:  Patient ID: April Lucas, female    DOB: 1986/03/21  Age: 29 y.o. MRN: [id number]  29 year old lady who has dental pain. She is a mother in raises 2 daughters. Her husband and her are in here today. She has a history of dental problems in the past. She recently had a root canal on a different tooth. Now she has a painful left lower molar that has been hurting her for several days, getting steadily worse. She has not seen her contact a dentist yet. She is not on any regular medicines and not allergic to any medicines. Past, family, social, medical histories were reviewed.   Objective:   No acute distress except she talks like she has a little cotton in her mouth. Her neck is supple without significant nodes. Throat clear. She has a broken off tooth on the left lower molar. This has some surrounding erythema is very tender all around it. She is swollen on her left cheek.  Assessment & Plan:   Assessment: Dental pain and dental abscess  Plan: Patient Instructions  Take the amoxicillin one twice daily for infection  Take the tramadol 50 mg 1 every 8 hours as needed for pain. For increased pain relief you can take 2 Tylenol with the tramadol if needed. The interaction between Tylenol and tramadol may get a stronger medicine.  Contact your dentist and arrange for further care    Davy Faught, MD 12/27/2014

## 2015-02-27 ENCOUNTER — Encounter (HOSPITAL_COMMUNITY): Payer: Self-pay | Admitting: Anesthesiology

## 2015-02-27 NOTE — Anesthesia Preprocedure Evaluation (Deleted)
Anesthesia Evaluation  Patient identified by MRN, date of birth, ID band Patient awake    Reviewed: Allergy & Precautions, NPO status , Patient's Chart, lab work & pertinent test results  Airway        Dental   Pulmonary former smoker,          Cardiovascular negative cardio ROS      Neuro/Psych Anxiety negative neurological ROS     GI/Hepatic negative GI ROS, Neg liver ROS,   Endo/Other  diabetes  Renal/GU      Musculoskeletal   Abdominal   Peds  Hematology   Anesthesia Other Findings   Reproductive/Obstetrics                            Anesthesia Physical Anesthesia Plan  ASA: II  Anesthesia Plan: General   Post-op Pain Management:    Induction: Intravenous  Airway Management Planned: Oral ETT  Additional Equipment:   Intra-op Plan:   Post-operative Plan:   Informed Consent: I have reviewed the patients History and Physical, chart, labs and discussed the procedure including the risks, benefits and alternatives for the proposed anesthesia with the patient or authorized representative who has indicated his/her understanding and acceptance.     Plan Discussed with:   Anesthesia Plan Comments: (If 1st trimester and if no reflux HX may consider MAC,check am labs)        Anesthesia Quick Evaluation

## 2015-02-28 ENCOUNTER — Encounter (HOSPITAL_COMMUNITY): Admission: RE | Payer: Self-pay | Source: Ambulatory Visit

## 2015-02-28 ENCOUNTER — Ambulatory Visit (HOSPITAL_COMMUNITY)
Admission: RE | Admit: 2015-02-28 | Payer: Managed Care, Other (non HMO) | Source: Ambulatory Visit | Admitting: Obstetrics and Gynecology

## 2015-02-28 SURGERY — DILATION AND EVACUATION, UTERUS
Anesthesia: Choice

## 2015-03-13 ENCOUNTER — Inpatient Hospital Stay (HOSPITAL_COMMUNITY)
Admission: AD | Admit: 2015-03-13 | Discharge: 2015-03-13 | Disposition: A | Discharge status: Home / Self Care | Payer: Managed Care, Other (non HMO) | Source: Ambulatory Visit | Attending: Obstetrics & Gynecology | Admitting: Obstetrics & Gynecology

## 2015-03-13 ENCOUNTER — Inpatient Hospital Stay (HOSPITAL_COMMUNITY): Payer: Managed Care, Other (non HMO)

## 2015-03-13 ENCOUNTER — Encounter (HOSPITAL_COMMUNITY): Payer: Self-pay

## 2015-03-13 DIAGNOSIS — O021 Missed abortion: Secondary | ICD-10-CM | POA: Insufficient documentation

## 2015-03-13 DIAGNOSIS — N939 Abnormal uterine and vaginal bleeding, unspecified: Secondary | ICD-10-CM | POA: Diagnosis present

## 2015-03-13 DIAGNOSIS — O4691 Antepartum hemorrhage, unspecified, first trimester: Secondary | ICD-10-CM

## 2015-03-13 DIAGNOSIS — Z3A09 9 weeks gestation of pregnancy: Secondary | ICD-10-CM | POA: Insufficient documentation

## 2015-03-13 DIAGNOSIS — D259 Leiomyoma of uterus, unspecified: Secondary | ICD-10-CM | POA: Insufficient documentation

## 2015-03-13 DIAGNOSIS — O209 Hemorrhage in early pregnancy, unspecified: Secondary | ICD-10-CM

## 2015-03-13 DIAGNOSIS — Z87891 Personal history of nicotine dependence: Secondary | ICD-10-CM | POA: Diagnosis not present

## 2015-03-13 LAB — URINALYSIS, ROUTINE W REFLEX MICROSCOPIC
Bilirubin Urine: NEGATIVE
Glucose, UA: NEGATIVE mg/dL
Ketones, ur: NEGATIVE mg/dL
LEUKOCYTES UA: NEGATIVE
NITRITE: NEGATIVE
PH: 6 (ref 5.0–8.0)
Protein, ur: NEGATIVE mg/dL
SPECIFIC GRAVITY, URINE: 1.025 (ref 1.005–1.030)
Urobilinogen, UA: 0.2 mg/dL (ref 0.0–1.0)

## 2015-03-13 LAB — HCG, QUANTITATIVE, PREGNANCY: hCG, Beta Chain, Quant, S: 23068 m[IU]/mL — ABNORMAL HIGH (ref <5)

## 2015-03-13 LAB — URINE MICROSCOPIC-ADD ON

## 2015-03-13 LAB — CBC
HEMATOCRIT: 34 % — AB (ref 36.0–46.0)
Hemoglobin: 11.3 g/dL — ABNORMAL LOW (ref 12.0–15.0)
MCH: 23.7 pg — ABNORMAL LOW (ref 26.0–34.0)
MCHC: 33.2 g/dL (ref 30.0–36.0)
MCV: 71.4 fL — AB (ref 78.0–100.0)
Platelets: 255 10*3/uL (ref 150–400)
RBC: 4.76 MIL/uL (ref 3.87–5.11)
RDW: 14.2 % (ref 11.5–15.5)
WBC: 7.2 10*3/uL (ref 4.0–10.5)

## 2015-03-13 LAB — WET PREP, GENITAL
CLUE CELLS WET PREP: NONE SEEN
Trich, Wet Prep: NONE SEEN
Yeast Wet Prep HPF POC: NONE SEEN

## 2015-03-13 LAB — POCT PREGNANCY, URINE: Preg Test, Ur: POSITIVE — AB

## 2015-03-13 MED ORDER — ACETAMINOPHEN 325 MG PO TABS
650.0000 mg | ORAL_TABLET | Freq: Once | ORAL | Status: AC
  Administered 2015-03-13: 650 mg via ORAL
  Filled 2015-03-13: qty 2

## 2015-03-13 NOTE — MAU Provider Note (Signed)
History   [id number]   Chief Complaint  Patient presents with  . Vaginal Bleeding    HPI April Lucas is a 29 y.o. female  [phone] here with report of vaginal bleeding x 3 days.   Pt started having brown discharge on Sunday that has become bright red bleeding today. Also reports lower abd cramping, started this morning.  Denies passing clots.  Appointment Dr. Gaetano Net on Thursday.  Patient's last menstrual period was 01/07/2015 (approximate).  OB History  Gravida Para Term Preterm AB SAB TAB Ectopic Multiple Living  3 2 1 1      2     # Outcome Date GA Lbr Len/2nd Weight Sex Delivery Anes PTL Lv  3 Current           2 Preterm 08/19/12 [redacted]w[redacted]d 05:32 / 00:16 3.135 kg (6 lb 14.6 oz) F Vag-Spont Local  Y  1 Term 10/20/05    F Vag-Spont EPI N Y      Past Medical History  Diagnosis Date  . Anemia   . Fibroid   . Diabetes mellitus without complication     GDM diet controlled; 2nd pg only  . Obesity   . Anxiety   . Hypertension     Gestational hypertension only    Family History  Problem Relation Age of Onset  . Hypertension Other   . Diabetes Other   . Other Neg Hx   . Hearing loss Neg Hx   . Hypertension Father   . Diabetes Father     Social History   Social History  . Marital Status: Single    Spouse Name: N/A  . Number of Children: N/A  . Years of Education: N/A   Social History Main Topics  . Smoking status: Former Research scientist (life sciences)  . Smokeless tobacco: Never Used     Comment: as teenager  . Alcohol Use: No     Comment: not with preg  . Drug Use: No  . Sexual Activity: Yes    Birth Control/ Protection: None   Other Topics Concern  . None   Social History Narrative    No Known Allergies  No current facility-administered medications on file prior to encounter.   Current Outpatient Prescriptions on File Prior to Encounter  Medication Sig Dispense Refill  . amoxicillin (AMOXIL) 875 MG tablet Take 1 tablet (875 mg total) by mouth 2 (two) times daily.  (Patient not taking: Reported on 03/13/2015) 20 tablet 0  . traMADol (ULTRAM) 50 MG tablet Take 1 tablet (50 mg total) by mouth every 8 (eight) hours as needed. (Patient not taking: Reported on 03/13/2015) 24 tablet 0     Review of Systems  Gastrointestinal: Negative for nausea and vomiting.  Genitourinary: Positive for vaginal bleeding and pelvic pain.  Neurological: Negative for dizziness.  All other systems reviewed and are negative.    Physical Exam   Filed Vitals:   03/13/15 0923  BP: 131/79  Pulse: 88  Temp: 98.2 F (36.8 C)  TempSrc: Oral  Resp: 18  Height: 5\' 3"  (1.6 m)  Weight: 98.158 kg (216 lb 6.4 oz)  SpO2: 100%    Physical Exam  Constitutional: She is oriented to person, place, and time. She appears well-developed and well-nourished. No distress.  HENT:  Head: Normocephalic.  Neck: Normal range of motion. Neck supple.  Cardiovascular: Normal rate, regular rhythm and normal heart sounds.   Respiratory: Effort normal and breath sounds normal. No respiratory distress.  GI: Soft. She exhibits no mass. There is  no tenderness. There is no rebound and no guarding.  Genitourinary: Uterus is enlarged and tender. Right adnexum displays no mass, no tenderness and no fullness. Left adnexum displays no mass, no tenderness and no fullness. There is bleeding (moderate; negative clots) in the vagina.  Musculoskeletal: Normal range of motion.  Neurological: She is alert and oriented to person, place, and time.  Skin: Skin is warm and dry.    MAU Course  Procedures  MDM Results for orders placed or performed during the hospital encounter of 03/13/15 (from the past 24 hour(s))  Urinalysis, Routine w reflex microscopic (not at [facility])     Status: Abnormal   Collection Time: 03/13/15  9:25 AM  Result Value Ref Range   Color, Urine YELLOW YELLOW   APPearance CLEAR CLEAR   Specific Gravity, Urine 1.025 1.005 - 1.030   pH 6.0 5.0 - 8.0   Glucose, UA NEGATIVE NEGATIVE mg/dL   Hgb  urine dipstick LARGE (A) NEGATIVE   Bilirubin Urine NEGATIVE NEGATIVE   Ketones, ur NEGATIVE NEGATIVE mg/dL   Protein, ur NEGATIVE NEGATIVE mg/dL   Urobilinogen, UA 0.2 0.0 - 1.0 mg/dL   Nitrite NEGATIVE NEGATIVE   Leukocytes, UA NEGATIVE NEGATIVE  Urine microscopic-add on     Status: Abnormal   Collection Time: 03/13/15  9:25 AM  Result Value Ref Range   Squamous Epithelial / LPF FEW (A) RARE   WBC, UA 0-2 <3 WBC/hpf   RBC / HPF 21-50 <3 RBC/hpf   Bacteria, UA FEW (A) RARE   Urine-Other MUCOUS PRESENT   Pregnancy, urine POC     Status: Abnormal   Collection Time: 03/13/15  9:36 AM  Result Value Ref Range   Preg Test, Ur POSITIVE (A) NEGATIVE  CBC     Status: Abnormal   Collection Time: 03/13/15 10:00 AM  Result Value Ref Range   WBC 7.2 4.0 - 10.5 K/uL   RBC 4.76 3.87 - 5.11 MIL/uL   Hemoglobin 11.3 (L) 12.0 - 15.0 g/dL   HCT 34.0 (L) 36.0 - 46.0 %   MCV 71.4 (L) 78.0 - 100.0 fL   MCH 23.7 (L) 26.0 - 34.0 pg   MCHC 33.2 30.0 - 36.0 g/dL   RDW 14.2 11.5 - 15.5 %   Platelets 255 150 - 400 K/uL  hCG, quantitative, pregnancy     Status: Abnormal   Collection Time: 03/13/15 10:00 AM  Result Value Ref Range   hCG, Beta Chain, Quant, S 23068 (H) <5 mIU/mL  Wet prep, genital     Status: Abnormal   Collection Time: 03/13/15 10:17 AM  Result Value Ref Range   Yeast Wet Prep HPF POC NONE SEEN NONE SEEN   Trich, Wet Prep NONE SEEN NONE SEEN   Clue Cells Wet Prep HPF POC NONE SEEN NONE SEEN   WBC, Wet Prep HPF POC RARE (A) NONE SEEN   Ultrasound: FINDINGS: Intrauterine gestational sac: Single.  Yolk sac: No  Embryo: None  Cardiac Activity: None  Heart Rate: Not applicable bpm  MSD: 37.1 mm 7 w 5 d  Maternal uterus/adnexae:  Subchorionic hemorrhage: Small subchorionic hemorrhage noted.  Right ovary: Normal  Left ovary: Normal  Other :There is an anterior fibroid measuring 3 x 2.9 x 3.5 cm.  Free fluid: Small amount of free fluid is  identified.  IMPRESSION: 1. Single intrauterine gestational sac. The mean sac diameter is 27.2 mm. There is no embryo. Findings meet definitive criteria for failed pregnancy. This follows SRU consensus guidelines: Diagnostic Criteria  for Nonviable Pregnancy Early in the First Trimester. Alison Stalling J Med [phone]. 2. Small subchorionic hemorrhage.  73 Consulted with Dr. Roselie Awkward > Reviewed HPI/Exam/labs/ultrasound and pt desired plan of care > may follow-up in one week as desired with ectopic precautions Assessment and Plan  29 y.o. G3P1102 at [redacted]w[redacted]d IUP  Missed Abortion Fibroid  Plan: Discussed options:  Expectant management versus cytotec administration;  Patient opts for expectant management with follow-up in office in one week (pt desires to go to [facility]); Provider opts for patient to return in 48 hours to ensure that BHCG is dropping Reviewed bleeding/ectopic precautions  Gwen Pounds, CNM 03/13/2015 1:09 PM

## 2015-03-13 NOTE — MAU Note (Signed)
Pos HPT 2 weeks ago.  Pt started having brown discharge on Sunday, has become bright red bleeding today.  Has lower abd cramping, started this a.m.  Has appointment Dr. Gaetano Net on Thursday.

## 2015-03-14 LAB — GC/CHLAMYDIA PROBE AMP (~~LOC~~) NOT AT ARMC
Chlamydia: NEGATIVE
Neisseria Gonorrhea: NEGATIVE

## 2015-03-14 LAB — HIV ANTIBODY (ROUTINE TESTING W REFLEX): HIV Screen 4th Generation wRfx: NONREACTIVE

## 2015-03-15 ENCOUNTER — Inpatient Hospital Stay (HOSPITAL_COMMUNITY)
Admission: AD | Admit: 2015-03-15 | Discharge: 2015-03-15 | Disposition: A | Discharge status: Home / Self Care | Payer: Managed Care, Other (non HMO) | Source: Ambulatory Visit | Attending: Family Medicine | Admitting: Family Medicine

## 2015-03-15 DIAGNOSIS — O039 Complete or unspecified spontaneous abortion without complication: Secondary | ICD-10-CM | POA: Insufficient documentation

## 2015-03-15 DIAGNOSIS — Z3A00 Weeks of gestation of pregnancy not specified: Secondary | ICD-10-CM | POA: Diagnosis not present

## 2015-03-15 LAB — HCG, QUANTITATIVE, PREGNANCY: HCG, BETA CHAIN, QUANT, S: 5200 m[IU]/mL — AB (ref <5)

## 2015-03-15 NOTE — MAU Provider Note (Signed)
History   [id number]   Chief Complaint  Patient presents with  . Follow-up    HPI April Lucas is a 29 y.o. female [phone] here for follow-up BHCG.  Upon review of the records patient was first seen on 03/13/15 for vaginal bleeding. BHCG on that day was 23068.  Ultrasound showed IUGS without embryo; definitive criteria for failed pregnancy.  GC/CT and wet prep were collected.  Results were negative.   Pt discharged home to return in 48 hrs to ensure that HCG is dropping.   Pt here today with no report of improving abdominal pain & vaginal bleeding.   All other systems negative.    Patient's last menstrual period was 01/07/2015 (approximate).  OB History  Gravida Para Term Preterm AB SAB TAB Ectopic Multiple Living  3 2 1 1      2     # Outcome Date GA Lbr Len/2nd Weight Sex Delivery Anes PTL Lv  3 Current           2 Preterm 08/19/12 [redacted]w[redacted]d 05:32 / 00:16 3.135 kg (6 lb 14.6 oz) F Vag-Spont Local  Y  1 Term 10/20/05    F Vag-Spont EPI N Y      Past Medical History  Diagnosis Date  . Anemia   . Fibroid   . Diabetes mellitus without complication     GDM diet controlled; 2nd pg only  . Obesity   . Anxiety   . Hypertension     Gestational hypertension only    Family History  Problem Relation Age of Onset  . Hypertension Other   . Diabetes Other   . Other Neg Hx   . Hearing loss Neg Hx   . Hypertension Father   . Diabetes Father     Social History   Social History  . Marital Status: Single    Spouse Name: N/A  . Number of Children: N/A  . Years of Education: N/A   Social History Main Topics  . Smoking status: Former Research scientist (life sciences)  . Smokeless tobacco: Never Used     Comment: as teenager  . Alcohol Use: No     Comment: not with preg  . Drug Use: No  . Sexual Activity: Yes    Birth Control/ Protection: None   Other Topics Concern  . Not on file   Social History Narrative    No Known Allergies  No current facility-administered medications on file prior to  encounter.   Current Outpatient Prescriptions on File Prior to Encounter  Medication Sig Dispense Refill  . Prenatal Vit-Fe Fumarate-FA (PRENATAL MULTIVITAMIN) TABS tablet Take 1 tablet by mouth daily at 12 noon.    . promethazine (PHENERGAN) 25 MG tablet Take 25 mg by mouth every 8 (eight) hours as needed for nausea.        Physical Exam   Filed Vitals:   03/15/15 1315  BP: 123/84  Pulse: 86  Temp: 98.1 F (36.7 C)  Resp: 18    Physical Exam  MAU Course  Procedures  hCG, Beta Chain, Quant, S <5 mIU/mL 5200 (H) 34742 (H)CM          MDM HCG has dropped significantly Pt reports episode of increase abdominal cramping & bleeding yesterday. Has decreased today. Plans on going to the [facility] for follow up.   Assessment and Plan   1. Miscarriage     Discharge home Discussed reasons to return to MAU; including increased bleeding or fever F/u with [facility] within 2  weeks.    Jorje Guild, NP 03/15/2015 2:30 PM

## 2015-03-15 NOTE — MAU Note (Signed)
Pt here for f/u BHCG. Pt said pain is less still bleeding a little less today still changing pad frequently.

## 2015-03-15 NOTE — Discharge Instructions (Signed)
Miscarriage A miscarriage is the sudden loss of an unborn baby (fetus) before the 20th week of pregnancy. Most miscarriages happen in the first 3 months of pregnancy. Sometimes, it happens before a woman even knows she is pregnant. A miscarriage is also called a "spontaneous miscarriage" or "early pregnancy loss." Having a miscarriage can be an emotional experience. Talk with your caregiver about any questions you may have about miscarrying, the grieving process, and your future pregnancy plans. CAUSES   Problems with the fetal chromosomes that make it impossible for the baby to develop normally. Problems with the baby's genes or chromosomes are most often the result of errors that occur, by chance, as the embryo divides and grows. The problems are not inherited from the parents.  Infection of the cervix or uterus.   Hormone problems.   Problems with the cervix, such as having an incompetent cervix. This is when the tissue in the cervix is not strong enough to hold the pregnancy.   Problems with the uterus, such as an abnormally shaped uterus, uterine fibroids, or congenital abnormalities.   Certain medical conditions.   Smoking, drinking alcohol, or taking illegal drugs.   Trauma.  Often, the cause of a miscarriage is unknown.  SYMPTOMS   Vaginal bleeding or spotting, with or without cramps or pain.  Pain or cramping in the abdomen or lower back.  Passing fluid, tissue, or blood clots from the vagina. DIAGNOSIS  Your caregiver will perform a physical exam. You may also have an ultrasound to confirm the miscarriage. Blood or urine tests may also be ordered. TREATMENT   Sometimes, treatment is not necessary if you naturally pass all the fetal tissue that was in the uterus. If some of the fetus or placenta remains in the body (incomplete miscarriage), tissue left behind may become infected and must be removed. Usually, a dilation and curettage (D and C) procedure is performed.  During a D and C procedure, the cervix is widened (dilated) and any remaining fetal or placental tissue is gently removed from the uterus.  Antibiotic medicines are prescribed if there is an infection. Other medicines may be given to reduce the size of the uterus (contract) if there is a lot of bleeding.  If you have Rh negative blood and your baby was Rh positive, you will need a Rh immunoglobulin shot. This shot will protect any future baby from having Rh blood problems in future pregnancies. HOME CARE INSTRUCTIONS   Your caregiver may order bed rest or may allow you to continue light activity. Resume activity as directed by your caregiver.  Have someone help with home and family responsibilities during this time.   Keep track of the number of sanitary pads you use each day and how soaked (saturated) they are. Write down this information.   Do not use tampons. Do not douche or have sexual intercourse until approved by your caregiver.   Only take over-the-counter or prescription medicines for pain or discomfort as directed by your caregiver.   Do not take aspirin. Aspirin can cause bleeding.   Keep all follow-up appointments with your caregiver.   If you or your partner have problems with grieving, talk to your caregiver or seek counseling to help cope with the pregnancy loss. Allow enough time to grieve before trying to get pregnant again.  SEEK IMMEDIATE MEDICAL CARE IF:   You have severe cramps or pain in your back or abdomen.  You have a fever.  You pass large blood clots (walnut-sized   or larger) ortissue from your vagina. Save any tissue for your caregiver to inspect.   Your bleeding increases.   You have a thick, bad-smelling vaginal discharge.  You become lightheaded, weak, or you faint.   You have chills.  MAKE SURE YOU:  Understand these instructions.  Will watch your condition.  Will get help right away if you are not doing well or get  worse. Document Released: 12/17/2000 Document Revised: 10/18/2012 Document Reviewed: 08/12/2011 ExitCare Patient Information 2015 [employer]. This information is not intended to replace advice given to you by your health care provider. Make sure you discuss any questions you have with your health care provider.  

## 2015-04-07 DIAGNOSIS — O039 Complete or unspecified spontaneous abortion without complication: Secondary | ICD-10-CM

## 2015-04-07 HISTORY — DX: Complete or unspecified spontaneous abortion without complication: O03.9

## 2015-05-08 ENCOUNTER — Ambulatory Visit (INDEPENDENT_AMBULATORY_CARE_PROVIDER_SITE_OTHER): Payer: Managed Care, Other (non HMO) | Admitting: Medical

## 2015-05-08 ENCOUNTER — Encounter: Payer: Self-pay | Admitting: Medical

## 2015-05-08 VITALS — BP 100/58 | HR 96 | Temp 98.0°F | Ht 64.0 in | Wt 220.0 lb

## 2015-05-08 DIAGNOSIS — Z23 Encounter for immunization: Secondary | ICD-10-CM

## 2015-05-08 DIAGNOSIS — E669 Obesity, unspecified: Secondary | ICD-10-CM | POA: Diagnosis not present

## 2015-05-08 DIAGNOSIS — Z0000 Encounter for general adult medical examination without abnormal findings: Secondary | ICD-10-CM | POA: Diagnosis not present

## 2015-05-08 DIAGNOSIS — H6121 Impacted cerumen, right ear: Secondary | ICD-10-CM | POA: Insufficient documentation

## 2015-05-08 LAB — POCT URINALYSIS DIPSTICK
Bilirubin, UA: NEGATIVE
Blood, UA: 1
Glucose, UA: NEGATIVE
Ketones, UA: NEGATIVE
Leukocytes, UA: NEGATIVE
Nitrite, UA: NEGATIVE
Protein, UA: NEGATIVE
Spec Grav, UA: 1.025
Urobilinogen, UA: 0.2
pH, UA: 7.5

## 2015-05-08 NOTE — Patient Instructions (Signed)
Please check insurance coverage for the following:  [place]

## 2015-05-08 NOTE — Progress Notes (Signed)
Subjective:   HPI  April Lucas is a 29 y.o. female who presents for a complete physical.  Medical care team includes:  Gynecology  Eye doctor Dorothea Ogle, PA-C here with Dr. Redmond School.   Concerns: Wants help with weight loss.    Right ear hearing decreased, possibly wax buildup.  Reviewed their medical, surgical, family, social, medication, and allergy history and updated chart as appropriate.  Past Medical History  Diagnosis Date  . Anemia   . Fibroid   . Diabetes mellitus without complication (HCC)     GDM diet controlled; 2nd pg only  . Obesity   . Anxiety   . Hypertension     Gestational hypertension only  . Miscarriage 04/2015    Past Surgical History  Procedure Laterality Date  . Extraction of wisdom teeth      Social History   Social History  . Marital Status: Single    Spouse Name: N/A  . Number of Children: N/A  . Years of Education: N/A   Occupational History  . Not on file.   Social History Main Topics  . Smoking status: Former Research scientist (life sciences)  . Smokeless tobacco: Never Used     Comment: as teenager  . Alcohol Use: 1.8 oz/week    3 Glasses of wine per week     Comment: not with preg  . Drug Use: No  . Sexual Activity: Yes    Birth Control/ Protection: None   Other Topics Concern  . Not on file   Social History Narrative   Married, as 28yo and 75 yo children, unemployed as of 04/2015.  No current exercise.       Family History  Problem Relation Age of Onset  . Hypertension Other   . Diabetes Other   . Other Neg Hx   . Hearing loss Neg Hx   . Hypertension Father   . Diabetes Father      Current outpatient prescriptions:  .  Prenatal Vit-Fe Fumarate-FA (PRENATAL MULTIVITAMIN) TABS tablet, Take 1 tablet by mouth daily at 12 noon., Disp: , Rfl:  .  promethazine (PHENERGAN) 25 MG tablet, Take 25 mg by mouth every 8 (eight) hours as needed for nausea. , Disp: , Rfl:   No Known Allergies     Review of Systems Constitutional:  -fever, -chills, -sweats, -unexpected weight change, -decreased appetite, -fatigue Allergy: -sneezing, -itching, -congestion Dermatology: -changing moles, --rash, -lumps ENT: -runny nose, -ear pain, -sore throat, -hoarseness, -sinus pain, -teeth pain, - ringing in ears, +hearing loss, -nosebleeds Cardiology: -chest pain, -palpitations, -swelling, -difficulty breathing when lying flat, -waking up short of breath Respiratory: -cough, -shortness of breath, -difficulty breathing with exercise or exertion, -wheezing, -coughing up blood Gastroenterology: -abdominal pain, -nausea, -vomiting, -diarrhea, -constipation, -blood in stool, -changes in bowel movement, -difficulty swallowing or eating Hematology: -bleeding, -bruising  Musculoskeletal: -joint aches, -muscle aches, -joint swelling, -back pain, -neck pain, -cramping, -changes in gait Ophthalmology: denies vision changes, eye redness, itching, discharge Urology: -burning with urination, -difficulty urinating, -blood in urine, -urinary frequency, -urgency, -incontinence Neurology: -headache, -weakness, -tingling, -numbness, -memory loss, -falls, -dizziness Psychology: -depressed mood, -agitation, -sleep problems     Objective:   Physical Exam  BP 100/58 mmHg  Pulse 96  Temp(Src) 98 F (36.7 C) (Oral)  Ht 5\' 4"  (1.626 m)  Wt 220 lb (99.791 kg)  BMI 37.74 kg/m2  LMP 01/07/2015 (Approximate)  Breastfeeding? Unknown  General appearance: alert, no distress, WD/WN,  Skin: few scattered macules, striae of upper abdomen, no worrisome lesions HEENT:  normocephalic, conjunctiva/corneas normal, sclerae anicteric, PERRLA, EOMi, left TM and canal normal, right canal with impacted cerumen, nares patent, no discharge or erythema, pharynx normal Oral cavity: MMM, tongue normal, teeth in good repair Neck: supple, no lymphadenopathy, no thyromegaly, no masses, normal ROM, no bruits Chest: non tender, normal shape and expansion Heart: RRR, normal S1, S2,  no murmurs Lungs: CTA bilaterally, no wheezes, rhonchi, or rales Abdomen: +bs, soft, non tender, non distended, no masses, no hepatomegaly, no splenomegaly, no bruits Back: non tender, normal ROM, no scoliosis Musculoskeletal: upper extremities non tender, no obvious deformity, normal ROM throughout, lower extremities non tender, no obvious deformity, normal ROM throughout Extremities: no edema, no cyanosis, no clubbing Pulses: 2+ symmetric, upper and lower extremities, normal cap refill Neurological: alert, oriented x 3, CN2-12 intact, strength normal upper extremities and lower extremities, sensation normal throughout, DTRs 2+ throughout, no cerebellar signs, gait normal Psychiatric: normal affect, behavior normal, pleasant  Breast/gyn - deferred to gyn    Assessment and Plan :    Encounter Diagnoses  Name Primary?  . Encounter for health maintenance examination in adult Yes  . Obesity   . Impacted cerumen, right   . Need for prophylactic vaccination and inoculation against influenza      Physical exam - discussed healthy lifestyle, diet, exercise, preventative care, vaccinations, and addressed their concerns.  Handout given. See your eye doctor yearly for routine vision care. See your dentist yearly for routine dental care including hygiene visits twice yearly. Obesity - discussed using My Fitness Pal to track calories, discussed exercise, f/u pending labs Counseled on the influenza virus vaccine.  Vaccine information sheet given.  Influenza vaccine given after consent obtained. Impacted cerumen - Discussed findings.  Discussed risk/benefits of procedure and patient agrees to procedure. Successfully used warm water lavage to remove impacted cerumen from right ear canal. Patient tolerated procedure well. Advised they avoid using any cotton swabs or other devices to clean the ear canals.  Use basic hygiene as discussed.  Follow up prn.  Follow-up pending labs

## 2015-05-09 LAB — LIPID PANEL
CHOL/HDL RATIO: 3.7 ratio (ref <=5.0)
Cholesterol: 154 mg/dL (ref 125–200)
HDL: 42 mg/dL — AB (ref >=46)
LDL Cholesterol: 101 mg/dL (ref <130)
Triglycerides: 56 mg/dL (ref <150)
VLDL: 11 mg/dL (ref <30)

## 2015-05-09 LAB — COMPREHENSIVE METABOLIC PANEL
ALBUMIN: 3.7 g/dL (ref 3.6–5.1)
ALK PHOS: 53 U/L (ref 33–115)
ALT: 9 U/L (ref 6–29)
AST: 12 U/L (ref 10–30)
BILIRUBIN TOTAL: 0.5 mg/dL (ref 0.2–1.2)
BUN: 8 mg/dL (ref 7–25)
CALCIUM: 8.8 mg/dL (ref 8.6–10.2)
CO2: 21 mmol/L (ref 20–31)
Chloride: 108 mmol/L (ref 98–110)
Creat: 0.58 mg/dL (ref 0.50–1.10)
Glucose, Bld: 79 mg/dL (ref 65–99)
POTASSIUM: 4.5 mmol/L (ref 3.5–5.3)
Sodium: 139 mmol/L (ref 135–146)
Total Protein: 6.3 g/dL (ref 6.1–8.1)

## 2015-05-09 LAB — HEMOGLOBIN A1C
HEMOGLOBIN A1C: 5.2 % (ref <5.7)
Mean Plasma Glucose: 103 mg/dL (ref <117)

## 2015-05-09 LAB — TSH: TSH: 1.906 u[IU]/mL (ref 0.350–4.500)

## 2015-05-29 ENCOUNTER — Other Ambulatory Visit: Payer: Self-pay

## 2015-06-14 ENCOUNTER — Emergency Department (HOSPITAL_COMMUNITY)
Admission: EM | Admit: 2015-06-14 | Discharge: 2015-06-14 | Disposition: A | Discharge status: Home / Self Care | Payer: Managed Care, Other (non HMO) | Attending: Emergency Medicine | Admitting: Emergency Medicine

## 2015-06-14 ENCOUNTER — Encounter (HOSPITAL_COMMUNITY): Payer: Self-pay | Admitting: Emergency Medicine

## 2015-06-14 DIAGNOSIS — K0889 Other specified disorders of teeth and supporting structures: Secondary | ICD-10-CM | POA: Insufficient documentation

## 2015-06-14 DIAGNOSIS — E669 Obesity, unspecified: Secondary | ICD-10-CM | POA: Insufficient documentation

## 2015-06-14 DIAGNOSIS — K029 Dental caries, unspecified: Secondary | ICD-10-CM | POA: Diagnosis not present

## 2015-06-14 DIAGNOSIS — Z8742 Personal history of other diseases of the female genital tract: Secondary | ICD-10-CM | POA: Diagnosis not present

## 2015-06-14 DIAGNOSIS — Z87891 Personal history of nicotine dependence: Secondary | ICD-10-CM | POA: Insufficient documentation

## 2015-06-14 DIAGNOSIS — Z862 Personal history of diseases of the blood and blood-forming organs and certain disorders involving the immune mechanism: Secondary | ICD-10-CM | POA: Diagnosis not present

## 2015-06-14 DIAGNOSIS — Z8659 Personal history of other mental and behavioral disorders: Secondary | ICD-10-CM | POA: Diagnosis not present

## 2015-06-14 DIAGNOSIS — K0381 Cracked tooth: Secondary | ICD-10-CM | POA: Diagnosis not present

## 2015-06-14 MED ORDER — DICLOFENAC SODIUM 50 MG PO TBEC: 50.0000 mg | DELAYED_RELEASE_TABLET | Freq: Two times a day (BID) | ORAL | Status: DC

## 2015-06-14 MED ORDER — CLINDAMYCIN HCL 150 MG PO CAPS: 150.0000 mg | ORAL_CAPSULE | Freq: Four times a day (QID) | ORAL | Status: DC

## 2015-06-14 NOTE — ED Provider Notes (Signed)
CSN: [id number]     Arrival date & time 06/14/15  1121 History  By signing my name below, I, April Lucas, attest that this documentation has been prepared under the direction and in the presence of Marcene Brawn, PA-C. Electronically Signed: Starleen Lucas ED Scribe. 06/14/2015. 12:20 PM.   lower left Chief Complaint  Patient presents with  . Dental Pain   The history is provided by the patient. No language interpreter was used.   HPI Comments: April Lucas is a 29 y.o. female with no chronic conditions who presents to the Emergency Department complaining of left-sided, lower > upper dental pain onset 1 week ago; unrelieved by OTC pain medication.  She reports having fractured a tooth recently in the area of pain.  Patient is seen by [facility] for her dental care, but due to insurance complications, cannot schedule an appointment until 08/2015.  She reports a family hx of DM in the father and grandfather.  NKA.  Patient does not use tobacco.    Past Medical History  Diagnosis Date  . Anemia   . Fibroid   . Diabetes mellitus without complication (HCC)     GDM diet controlled; 2nd pg only  . Obesity   . Anxiety   . Hypertension     Gestational hypertension only  . Miscarriage 04/2015   Past Surgical History  Procedure Laterality Date  . Extraction of wisdom teeth     Family History  Problem Relation Age of Onset  . Hypertension Other   . Diabetes Other   . Other Neg Hx   . Hearing loss Neg Hx   . Hypertension Father   . Diabetes Father    Social History  Substance Use Topics  . Smoking status: Former Research scientist (life sciences)  . Smokeless tobacco: Never Used     Comment: as teenager  . Alcohol Use: 1.8 oz/week    3 Glasses of wine per week     Comment: not with preg   OB History    Gravida Para Term Preterm AB TAB SAB Ectopic Multiple Living   3 2 1 1      2      Review of Systems  HENT: Positive for dental problem.   All other systems reviewed and are  negative.     Allergies  Review of patient's allergies indicates no known allergies.  Home Medications   Prior to Admission medications   Medication Sig Start Date End Date Taking? Authorizing Provider  naproxen sodium (ANAPROX) 220 MG tablet Take 440 mg by mouth 2 (two) times daily as needed (tooth pain.).   Yes Historical Provider, MD   BP 119/71 mmHg  Pulse 93  Temp(Src) 98.5 F (36.9 C) (Oral)  Resp 18  SpO2 96%  LMP 06/08/2015 Physical Exam  Constitutional: She is oriented to person, place, and time. She appears well-developed and well-nourished. No distress.  HENT:  Head: Normocephalic and atraumatic.  Severe dental decay lower left.    Eyes: Conjunctivae and EOM are normal.  Neck: Neck supple. No tracheal deviation present.  Cardiovascular: Normal rate.   Pulmonary/Chest: Effort normal. No respiratory distress.  Musculoskeletal: Normal range of motion.  Neurological: She is alert and oriented to person, place, and time.  Skin: Skin is warm and dry.  Psychiatric: She has a normal mood and affect. Her behavior is normal.  Nursing note and vitals reviewed.   ED Course  Procedures (including critical care time)  DIAGNOSTIC STUDIES: Oxygen Saturation is 96% on RA, normal  by my interpretation.    COORDINATION OF CARE:  12:17 PM Will prescribe antibiotics and pain medication.  Patient acknowledges and agrees with plan.    Labs Review Labs Reviewed - No data to display  Imaging Review No results found. I have personally reviewed and evaluated these images and lab results as part of my medical decision-making.   EKG Interpretation None      MDM   Final diagnoses:  Toothache   Clindamycin voltaren I personally performed the services in this documentation, which was scribed in my presence.  The recorded information has been reviewed and considered.   Ronnald Collum.   Hollace Kinnier [place], PA-C 06/14/15 [place], MD 06/15/15 [phone]

## 2015-06-14 NOTE — ED Notes (Signed)
Patient c/o dental pain x5 days.  Patient has obvious dental carries on upper and lower left molars.  Patient has used Aleve with some relief.  Patient denies fever and N/V.

## 2015-06-14 NOTE — Discharge Instructions (Signed)

## 2015-06-14 NOTE — ED Notes (Signed)
Per pt, states left lower and upper dental pain

## 2015-07-08 NOTE — L&D Delivery Note (Signed)
Delivery Note At 5:48 PM a healthy female was delivered via Vaginal, Spontaneous Delivery (Presentation: OA).  APGAR: 9, 9; weight  pending.   Placenta status: S/I, 3V Cord with the following complications: tight nuchal cord x 1; delivered through.  Cord pH: n/a  Anesthesia:  none Episiotomy: None Lacerations: 2nd degree Suture Repair: 3.0 vicryl Est. Blood Loss (mL):  250  Mom to postpartum.  Baby to Couplet care / Skin to Skin.  April Lucas, [place] 05/10/2016, 6:22 PM

## 2015-10-01 ENCOUNTER — Encounter (HOSPITAL_COMMUNITY): Payer: Self-pay | Admitting: *Deleted

## 2015-10-01 ENCOUNTER — Inpatient Hospital Stay (HOSPITAL_COMMUNITY): Payer: Managed Care, Other (non HMO)

## 2015-10-01 ENCOUNTER — Inpatient Hospital Stay (HOSPITAL_COMMUNITY)
Admission: AD | Admit: 2015-10-01 | Discharge: 2015-10-01 | Disposition: A | Discharge status: Home / Self Care | Payer: Managed Care, Other (non HMO) | Source: Ambulatory Visit | Attending: Obstetrics & Gynecology | Admitting: Obstetrics & Gynecology

## 2015-10-01 DIAGNOSIS — D259 Leiomyoma of uterus, unspecified: Secondary | ICD-10-CM | POA: Insufficient documentation

## 2015-10-01 DIAGNOSIS — O26891 Other specified pregnancy related conditions, first trimester: Secondary | ICD-10-CM | POA: Insufficient documentation

## 2015-10-01 DIAGNOSIS — O418X10 Other specified disorders of amniotic fluid and membranes, first trimester, not applicable or unspecified: Secondary | ICD-10-CM

## 2015-10-01 DIAGNOSIS — O209 Hemorrhage in early pregnancy, unspecified: Secondary | ICD-10-CM | POA: Insufficient documentation

## 2015-10-01 DIAGNOSIS — Z3A01 Less than 8 weeks gestation of pregnancy: Secondary | ICD-10-CM | POA: Diagnosis not present

## 2015-10-01 DIAGNOSIS — E119 Type 2 diabetes mellitus without complications: Secondary | ICD-10-CM | POA: Insufficient documentation

## 2015-10-01 DIAGNOSIS — Z87891 Personal history of nicotine dependence: Secondary | ICD-10-CM | POA: Diagnosis not present

## 2015-10-01 DIAGNOSIS — O26899 Other specified pregnancy related conditions, unspecified trimester: Secondary | ICD-10-CM

## 2015-10-01 DIAGNOSIS — O219 Vomiting of pregnancy, unspecified: Secondary | ICD-10-CM

## 2015-10-01 DIAGNOSIS — R112 Nausea with vomiting, unspecified: Secondary | ICD-10-CM | POA: Insufficient documentation

## 2015-10-01 DIAGNOSIS — Z3491 Encounter for supervision of normal pregnancy, unspecified, first trimester: Secondary | ICD-10-CM

## 2015-10-01 DIAGNOSIS — R109 Unspecified abdominal pain: Secondary | ICD-10-CM | POA: Diagnosis not present

## 2015-10-01 DIAGNOSIS — N83201 Unspecified ovarian cyst, right side: Secondary | ICD-10-CM | POA: Diagnosis not present

## 2015-10-01 DIAGNOSIS — O24311 Unspecified pre-existing diabetes mellitus in pregnancy, first trimester: Secondary | ICD-10-CM | POA: Insufficient documentation

## 2015-10-01 DIAGNOSIS — O10911 Unspecified pre-existing hypertension complicating pregnancy, first trimester: Secondary | ICD-10-CM | POA: Insufficient documentation

## 2015-10-01 DIAGNOSIS — O468X1 Other antepartum hemorrhage, first trimester: Secondary | ICD-10-CM

## 2015-10-01 DIAGNOSIS — O99211 Obesity complicating pregnancy, first trimester: Secondary | ICD-10-CM | POA: Insufficient documentation

## 2015-10-01 DIAGNOSIS — O9989 Other specified diseases and conditions complicating pregnancy, childbirth and the puerperium: Secondary | ICD-10-CM | POA: Diagnosis not present

## 2015-10-01 LAB — CBC
HCT: 36.6 % (ref 36.0–46.0)
Hemoglobin: 12.1 g/dL (ref 12.0–15.0)
MCH: 23.3 pg — AB (ref 26.0–34.0)
MCHC: 33.1 g/dL (ref 30.0–36.0)
MCV: 70.5 fL — AB (ref 78.0–100.0)
PLATELETS: 271 10*3/uL (ref 150–400)
RBC: 5.19 MIL/uL — AB (ref 3.87–5.11)
RDW: 14.8 % (ref 11.5–15.5)
WBC: 8.5 10*3/uL (ref 4.0–10.5)

## 2015-10-01 LAB — URINALYSIS, ROUTINE W REFLEX MICROSCOPIC
Bilirubin Urine: NEGATIVE
Glucose, UA: NEGATIVE mg/dL
Hgb urine dipstick: NEGATIVE
Ketones, ur: 15 mg/dL — AB
Leukocytes, UA: NEGATIVE
Nitrite: NEGATIVE
Protein, ur: NEGATIVE mg/dL
Specific Gravity, Urine: 1.02 (ref 1.005–1.030)
pH: 6.5 (ref 5.0–8.0)

## 2015-10-01 LAB — HCG, QUANTITATIVE, PREGNANCY: hCG, Beta Chain, Quant, S: 34272 m[IU]/mL — ABNORMAL HIGH (ref <5)

## 2015-10-01 LAB — COMPREHENSIVE METABOLIC PANEL WITH GFR
ALT: 9 U/L — ABNORMAL LOW (ref 14–54)
AST: 17 U/L (ref 15–41)
Albumin: 3.8 g/dL (ref 3.5–5.0)
Alkaline Phosphatase: 56 U/L (ref 38–126)
Anion gap: 6 (ref 5–15)
BUN: 5 mg/dL — ABNORMAL LOW (ref 6–20)
CO2: 22 mmol/L (ref 22–32)
Calcium: 9.1 mg/dL (ref 8.9–10.3)
Chloride: 108 mmol/L (ref 101–111)
Creatinine, Ser: 0.53 mg/dL (ref 0.44–1.00)
GFR calc Af Amer: >60 mL/min
GFR calc non Af Amer: >60 mL/min
Glucose, Bld: 90 mg/dL (ref 65–99)
Potassium: 4.4 mmol/L (ref 3.5–5.1)
Sodium: 136 mmol/L (ref 135–145)
Total Bilirubin: 0.8 mg/dL (ref 0.3–1.2)
Total Protein: 7.2 g/dL (ref 6.5–8.1)

## 2015-10-01 LAB — POCT PREGNANCY, URINE: Preg Test, Ur: POSITIVE — AB

## 2015-10-01 LAB — ABO/RH: ABO/RH(D): O POS

## 2015-10-01 MED ORDER — PROMETHAZINE HCL 25 MG/ML IJ SOLN
25.0000 mg | Freq: Once | INTRAMUSCULAR | Status: AC
  Administered 2015-10-01: 25 mg via INTRAVENOUS
  Filled 2015-10-01: qty 1

## 2015-10-01 MED ORDER — GLYCOPYRROLATE 0.2 MG/ML IJ SOLN
0.1000 mg | Freq: Once | INTRAMUSCULAR | Status: AC
  Administered 2015-10-01: 0.1 mg via INTRAVENOUS
  Filled 2015-10-01: qty 0.5

## 2015-10-01 MED ORDER — PROMETHAZINE HCL 25 MG PO TABS: 25.0000 mg | ORAL_TABLET | Freq: Four times a day (QID) | ORAL | Status: DC | PRN

## 2015-10-01 MED ORDER — LACTATED RINGERS IV BOLUS (SEPSIS)
1000.0000 mL | Freq: Once | INTRAVENOUS | Status: AC
  Administered 2015-10-01: 1000 mL via INTRAVENOUS

## 2015-10-01 NOTE — MAU Provider Note (Signed)
History     CSN: [id number]  Arrival date and time: 10/01/15 1233   First Provider Initiated Contact with Patient 10/01/15 1334       Chief Complaint  Patient presents with  . Nausea   HPI  April Lucas is a 30 y.o. [id number] at [redacted]w[redacted]d by LMP who presents with n/v and abdominal cramping. Reports n/v x 4 days. Tried treating with OTC meds without relief. Has vomited 4 times today. Last tried to eat 2 days ago.  Denies diarrhea, constipation, or fever.  Reports lower abdominal cramping x 2 days. Pain is constant and rates 5/10. Has no taken medication for pain.  Denies vaginal bleeding or discharge.  Is an established patient with [facility] & first prenatal visit is scheduled for next week.    OB History    Gravida Para Term Preterm AB TAB SAB Ectopic Multiple Living   4 2 1 1 1  1   2       Past Medical History  Diagnosis Date  . Anemia   . Fibroid   . Diabetes mellitus without complication (HCC)     GDM diet controlled; 2nd pg only  . Obesity   . Anxiety   . Hypertension     Gestational hypertension only  . Miscarriage 04/2015    Past Surgical History  Procedure Laterality Date  . Extraction of wisdom teeth      Family History  Problem Relation Age of Onset  . Hypertension Other   . Diabetes Other   . Other Neg Hx   . Hearing loss Neg Hx   . Hypertension Father   . Diabetes Father     Social History  Substance Use Topics  . Smoking status: Former Research scientist (life sciences)  . Smokeless tobacco: Never Used     Comment: as teenager  . Alcohol Use: 1.8 oz/week    3 Glasses of wine per week     Comment: not with preg    Allergies: No Known Allergies  Prescriptions prior to admission  Medication Sig Dispense Refill Last Dose  . clindamycin (CLEOCIN) 150 MG capsule Take 1 capsule (150 mg total) by mouth every 6 (six) hours. 28 capsule 0   . DICLEGIS 10-10 MG TBEC      . diclofenac (VOLTAREN) 50 MG EC tablet Take 1 tablet (50 mg total) by mouth 2 (two) times  daily. 20 tablet 0   . ibuprofen (ADVIL,MOTRIN) 800 MG tablet      . naproxen sodium (ANAPROX) 220 MG tablet Take 440 mg by mouth 2 (two) times daily as needed (tooth pain.).   06/13/2015 at Unknown time    Review of Systems  Constitutional: Negative.   Gastrointestinal: Positive for nausea, vomiting and abdominal pain. Negative for diarrhea and constipation.  Genitourinary: Negative.    Physical Exam   Blood pressure 119/68, pulse 72, temperature 98.7 F (37.1 C), temperature source Oral, resp. rate 16, height 5' 3.5" (1.613 m), weight 228 lb (103.42 kg), last menstrual period 08/19/2015, unknown if currently breastfeeding.  Physical Exam  Nursing note and vitals reviewed. Constitutional: She is oriented to person, place, and time. She appears well-developed and well-nourished. No distress.  HENT:  Head: Normocephalic and atraumatic.  Eyes: Conjunctivae are normal. Right eye exhibits no discharge. Left eye exhibits no discharge. No scleral icterus.  Neck: Normal range of motion.  Cardiovascular: Normal rate, regular rhythm and normal heart sounds.   No murmur heard. Respiratory: Effort normal and breath sounds normal. No respiratory distress.  She has no wheezes.  GI: Soft. Bowel sounds are normal. She exhibits no distension. There is no tenderness. There is no rebound and no guarding.  Neurological: She is alert and oriented to person, place, and time.  Skin: Skin is warm and dry. She is not diaphoretic.  Psychiatric: She has a normal mood and affect. Her behavior is normal. Judgment and thought content normal.    MAU Course  Procedures Results for orders placed or performed during the hospital encounter of 10/01/15 (from the past 24 hour(s))  Urinalysis, Routine w reflex microscopic (not at [facility])     Status: Abnormal   Collection Time: 10/01/15 12:54 PM  Result Value Ref Range   Color, Urine YELLOW YELLOW   APPearance CLEAR CLEAR   Specific Gravity, Urine 1.020 1.005 - 1.030    pH 6.5 5.0 - 8.0   Glucose, UA NEGATIVE NEGATIVE mg/dL   Hgb urine dipstick NEGATIVE NEGATIVE   Bilirubin Urine NEGATIVE NEGATIVE   Ketones, ur 15 (A) NEGATIVE mg/dL   Protein, ur NEGATIVE NEGATIVE mg/dL   Nitrite NEGATIVE NEGATIVE   Leukocytes, UA NEGATIVE NEGATIVE  Pregnancy, urine POC     Status: Abnormal   Collection Time: 10/01/15  1:23 PM  Result Value Ref Range   Preg Test, Ur POSITIVE (A) NEGATIVE  CBC     Status: Abnormal   Collection Time: 10/01/15  2:40 PM  Result Value Ref Range   WBC 8.5 4.0 - 10.5 K/uL   RBC 5.19 (H) 3.87 - 5.11 MIL/uL   Hemoglobin 12.1 12.0 - 15.0 g/dL   HCT 36.6 36.0 - 46.0 %   MCV 70.5 (L) 78.0 - 100.0 fL   MCH 23.3 (L) 26.0 - 34.0 pg   MCHC 33.1 30.0 - 36.0 g/dL   RDW 14.8 11.5 - 15.5 %   Platelets 271 150 - 400 K/uL  ABO/Rh     Status: None   Collection Time: 10/01/15  2:40 PM  Result Value Ref Range   ABO/RH(D) O POS   hCG, quantitative, pregnancy     Status: Abnormal   Collection Time: 10/01/15  2:40 PM  Result Value Ref Range   hCG, Beta Chain, Quant, S 34272 (H) <5 mIU/mL  Comprehensive metabolic panel     Status: Abnormal   Collection Time: 10/01/15  2:40 PM  Result Value Ref Range   Sodium 136 135 - 145 mmol/L   Potassium 4.4 3.5 - 5.1 mmol/L   Chloride 108 101 - 111 mmol/L   CO2 22 22 - 32 mmol/L   Glucose, Bld 90 65 - 99 mg/dL   BUN 5 (L) 6 - 20 mg/dL   Creatinine, Ser 0.53 0.44 - 1.00 mg/dL   Calcium 9.1 8.9 - 10.3 mg/dL   Total Protein 7.2 6.5 - 8.1 g/dL   Albumin 3.8 3.5 - 5.0 g/dL   AST 17 15 - 41 U/L   ALT 9 (L) 14 - 54 U/L   Alkaline Phosphatase 56 38 - 126 U/L   Total Bilirubin 0.8 0.3 - 1.2 mg/dL   GFR calc non Af Amer >60 >60 mL/min   GFR calc Af Amer >60 >60 mL/min   Anion gap 6 5 - 15   US Ob Comp Less 14 Wks  10/01/2015  CLINICAL DATA:  Abdominal pain EXAM: OBSTETRIC <14 WK [place] AND TRANSVAGINAL OB US TECHNIQUE: Both transabdominal and transvaginal ultrasound examinations were performed for complete  evaluation of the gestation as well as the maternal uterus, adnexal regions, and pelvic  cul-de-sac. Transvaginal technique was performed to assess early pregnancy. COMPARISON:  None. FINDINGS: Intrauterine gestational sac: Visualized/normal in shape. Yolk sac:  Present Embryo:  Present Cardiac Activity: Present Heart Rate: 104  bpm CRL:  2.4  mm   5 w   5 d                  [place] EDC: 05/28/2016 Subchorionic hemorrhage:  Small subchorionic hemorrhage. Maternal uterus/adnexae: Normal right ovary. 6 x 4.9 x 5 cm anechoic left ovarian mass most consistent with a cyst. Hypoechoic left uterine mass measuring 3.1 x 2.6 x 3.1 cm most consistent with a fibroid. Hypoechoic mid posterior uterine mass measuring 1.3 x 0.8 x 1.2 cm most consistent with a fibroid. Trace pelvic free fluid. IMPRESSION: 1. Single live intrauterine pregnancy as detailed above. 2. Small subchorionic hemorrhage. 3. Left ovarian cyst. Electronically Signed   By: Kathreen Devoid   On: 10/01/2015 16:45   US Ob Transvaginal  10/01/2015  CLINICAL DATA:  Abdominal pain EXAM: OBSTETRIC <14 WK [place] AND TRANSVAGINAL OB US TECHNIQUE: Both transabdominal and transvaginal ultrasound examinations were performed for complete evaluation of the gestation as well as the maternal uterus, adnexal regions, and pelvic cul-de-sac. Transvaginal technique was performed to assess early pregnancy. COMPARISON:  None. FINDINGS: Intrauterine gestational sac: Visualized/normal in shape. Yolk sac:  Present Embryo:  Present Cardiac Activity: Present Heart Rate: 104  bpm CRL:  2.4  mm   5 w   5 d                  [place] EDC: 05/28/2016 Subchorionic hemorrhage:  Small subchorionic hemorrhage. Maternal uterus/adnexae: Normal right ovary. 6 x 4.9 x 5 cm anechoic left ovarian mass most consistent with a cyst. Hypoechoic left uterine mass measuring 3.1 x 2.6 x 3.1 cm most consistent with a fibroid. Hypoechoic mid posterior uterine mass measuring 1.3 x 0.8 x 1.2 cm most consistent with a fibroid.  Trace pelvic free fluid. IMPRESSION: 1. Single live intrauterine pregnancy as detailed above. 2. Small subchorionic hemorrhage. 3. Left ovarian cyst. Electronically Signed   By: Kathreen Devoid   On: 10/01/2015 16:45    MDM UPT positive  O positive IV LR bolus Phenergan 25 mg IV Robinul 0.1 mg IV Pt reports feeling "much better" since IV fluids & meds Ultrasound shows SIUP with small Neeses. Also shows 3 cm uterine fibroid that is stable compared to ultrasound done in September. Also shows 6 cm right ovarian cyst. No evidence of torsion.  S/w Dr. Lynnette Caffey. Ok to discharge home with antiemetics.  Assessment and Plan  A: 1. Nausea and vomiting during pregnancy prior to [redacted] weeks gestation   2. Abdominal pain in pregnancy   3. Normal IUP (intrauterine pregnancy) on prenatal ultrasound, first trimester   4. Right ovarian cyst   5. Subchorionic hematoma in first trimester     P: Discharge home Rx phenergan Discussed reasons to return to MAU including s/s of ovarian torsion Keep prenatal appt with ob  Jorje Guild 10/01/2015, 1:34 PM

## 2015-10-01 NOTE — MAU Note (Signed)
Patient presents with a +HPT c/o abdominal pain X a couple of days and n/v X 5 days. Denies bleeding or discharge.

## 2015-10-01 NOTE — Discharge Instructions (Signed)
Morning Sickness Morning sickness is when you feel sick to your stomach (nauseous) during pregnancy. This nauseous feeling may or may not come with vomiting. It often occurs in the morning but can be a problem any time of day. Morning sickness is most common during the first trimester, but it may continue throughout pregnancy. While morning sickness is unpleasant, it is usually harmless unless you develop severe and continual vomiting (hyperemesis gravidarum). This condition requires more intense treatment.  CAUSES  The cause of morning sickness is not completely known but seems to be related to normal hormonal changes that occur in pregnancy. RISK FACTORS You are at greater risk if you:  Experienced nausea or vomiting before your pregnancy.  Had morning sickness during a previous pregnancy.  Are pregnant with more than one baby, such as twins. TREATMENT  Do not use any medicines (prescription, over-the-counter, or herbal) for morning sickness without first talking to your health care provider. Your health care provider may prescribe or recommend:  Vitamin B6 supplements.  Anti-nausea medicines.  The herbal medicine ginger. HOME CARE INSTRUCTIONS   Only take over-the-counter or prescription medicines as directed by your health care provider.  Taking multivitamins before getting pregnant can prevent or decrease the severity of morning sickness in most women.  Eat a piece of dry toast or unsalted crackers before getting out of bed in the morning.  Eat five or six small meals a day.  Eat dry and bland foods (rice, baked potato). Foods high in carbohydrates are often helpful.  Do not drink liquids with your meals. Drink liquids between meals.  Avoid greasy, fatty, and spicy foods.  Get someone to cook for you if the smell of any food causes nausea and vomiting.  If you feel nauseous after taking prenatal vitamins, take the vitamins at night or with a snack.  Snack on protein  foods (nuts, yogurt, cheese) between meals if you are hungry.  Eat unsweetened gelatins for desserts.  Wearing an acupressure wristband (worn for sea sickness) may be helpful.  Acupuncture may be helpful.  Do not smoke.  Get a humidifier to keep the air in your house free of odors.  Get plenty of fresh air. SEEK MEDICAL CARE IF:   Your home remedies are not working, and you need medicine.  You feel dizzy or lightheaded.  You are losing weight. SEEK IMMEDIATE MEDICAL CARE IF:   You have persistent and uncontrolled nausea and vomiting.  You pass out (faint). MAKE SURE YOU:  Understand these instructions.  Will watch your condition.  Will get help right away if you are not doing well or get worse.   This information is not intended to replace advice given to you by your health care provider. Make sure you discuss any questions you have with your health care provider.   Document Released: 08/14/2006 Document Revised: 06/28/2013 Document Reviewed: 12/08/2012 Elsevier Interactive Patient Education 2016 [place] Hematoma A subchorionic hematoma is a gathering of blood between the outer wall of the placenta and the inner wall of the womb (uterus). The placenta is the organ that connects the fetus to the wall of the uterus. The placenta performs the feeding, breathing (oxygen to the fetus), and waste removal (excretory work) of the fetus.  Subchorionic hematoma is the most common abnormality found on a result from ultrasonography done during the first trimester or early second trimester of pregnancy. If there has been little or no vaginal bleeding, early small hematomas usually shrink on their own  and do not affect your baby or pregnancy. The blood is gradually absorbed over 1-2 weeks. When bleeding starts later in pregnancy or the hematoma is larger or occurs in an older pregnant woman, the outcome may not be as good. Larger hematomas may get bigger, which increases  the chances for miscarriage. Subchorionic hematoma also increases the risk of premature detachment of the placenta from the uterus, preterm (premature) labor, and stillbirth. HOME CARE INSTRUCTIONS  Stay on bed rest if your health care provider recommends this. Although bed rest will not prevent more bleeding or prevent a miscarriage, your health care provider may recommend bed rest until you are advised otherwise.  Avoid heavy lifting (more than 10 lb [4.5 kg]), exercise, sexual intercourse, or douching as directed by your health care provider.  Keep track of the number of pads you use each day and how soaked (saturated) they are. Write down this information.  Do not use tampons.  Keep all follow-up appointments as directed by your health care provider. Your health care provider may ask you to have follow-up blood tests or ultrasound tests or both. SEEK IMMEDIATE MEDICAL CARE IF:  You have severe cramps in your stomach, back, abdomen, or pelvis.  You have a fever.  You pass large clots or tissue. Save any tissue for your health care provider to look at.  Your bleeding increases or you become lightheaded, feel weak, or have fainting episodes.   This information is not intended to replace advice given to you by your health care provider. Make sure you discuss any questions you have with your health care provider.   Document Released: 10/08/2006 Document Revised: 07/14/2014 Document Reviewed: 01/20/2013 Elsevier Interactive Patient Education 2016 [employer] Ovarian Cyst An ovarian cyst is a fluid-filled sac that forms on an ovary. The ovaries are small organs that produce eggs in women. Various types of cysts can form on the ovaries. Most are not cancerous. Many do not cause problems, and they often go away on their own. Some may cause symptoms and require treatment. Common types of ovarian cysts include:  Functional cysts--These cysts may occur every month during the menstrual cycle.  This is normal. The cysts usually go away with the next menstrual cycle if the woman does not get pregnant. Usually, there are no symptoms with a functional cyst.  Endometrioma cysts--These cysts form from the tissue that lines the uterus. They are also called "chocolate cysts" because they become filled with blood that turns brown. This type of cyst can cause pain in the lower abdomen during intercourse and with your menstrual period.  Cystadenoma cysts--This type develops from the cells on the outside of the ovary. These cysts can get very big and cause lower abdomen pain and pain with intercourse. This type of cyst can twist on itself, cut off its blood supply, and cause severe pain. It can also easily rupture and cause a lot of pain.  Dermoid cysts--This type of cyst is sometimes found in both ovaries. These cysts may contain different kinds of body tissue, such as skin, teeth, hair, or cartilage. They usually do not cause symptoms unless they get very big.  Theca lutein cysts--These cysts occur when too much of a certain hormone (human chorionic gonadotropin) is produced and overstimulates the ovaries to produce an egg. This is most common after procedures used to assist with the conception of a baby (in vitro fertilization). CAUSES   Fertility drugs can cause a condition in which multiple large cysts are formed  on the ovaries. This is called ovarian hyperstimulation syndrome.  A condition called polycystic ovary syndrome can cause hormonal imbalances that can lead to nonfunctional ovarian cysts. SIGNS AND SYMPTOMS  Many ovarian cysts do not cause symptoms. If symptoms are present, they may include:  Pelvic pain or pressure.  Pain in the lower abdomen.  Pain during sexual intercourse.  Increasing girth (swelling) of the abdomen.  Abnormal menstrual periods.  Increasing pain with menstrual periods.  Stopping having menstrual periods without being pregnant. DIAGNOSIS  These cysts  are commonly found during a routine or annual pelvic exam. Tests may be ordered to find out more about the cyst. These tests may include:  Ultrasound.  X-ray of the pelvis.  CT scan.  MRI.  Blood tests. TREATMENT  Many ovarian cysts go away on their own without treatment. Your health care provider may want to check your cyst regularly for 2-3 months to see if it changes. For women in menopause, it is particularly important to monitor a cyst closely because of the higher rate of ovarian cancer in menopausal women. When treatment is needed, it may include any of the following:  A procedure to drain the cyst (aspiration). This may be done using a long needle and ultrasound. It can also be done through a laparoscopic procedure. This involves using a thin, lighted tube with a tiny camera on the end (laparoscope) inserted through a small incision.  Surgery to remove the whole cyst. This may be done using laparoscopic surgery or an open surgery involving a larger incision in the lower abdomen.  Hormone treatment or birth control pills. These methods are sometimes used to help dissolve a cyst. HOME CARE INSTRUCTIONS   Only take over-the-counter or prescription medicines as directed by your health care provider.  Follow up with your health care provider as directed.  Get regular pelvic exams and Pap tests. SEEK MEDICAL CARE IF:   Your periods are late, irregular, or painful, or they stop.  Your pelvic pain or abdominal pain does not go away.  Your abdomen becomes larger or swollen.  You have pressure on your bladder or trouble emptying your bladder completely.  You have pain during sexual intercourse.  You have feelings of fullness, pressure, or discomfort in your stomach.  You lose weight for no apparent reason.  You feel generally ill.  You become constipated.  You lose your appetite.  You develop acne.  You have an increase in body and facial hair.  You are gaining  weight, without changing your exercise and eating habits.  You think you are pregnant. SEEK IMMEDIATE MEDICAL CARE IF:   You have increasing abdominal pain.  You feel sick to your stomach (nauseous), and you throw up (vomit).  You develop a fever that comes on suddenly.  You have abdominal pain during a bowel movement.  Your menstrual periods become heavier than usual. MAKE SURE YOU:  Understand these instructions.  Will watch your condition.  Will get help right away if you are not doing well or get worse.   This information is not intended to replace advice given to you by your health care provider. Make sure you discuss any questions you have with your health care provider.   Document Released: 06/23/2005 Document Revised: 06/28/2013 Document Reviewed: 02/28/2013 Elsevier Interactive Patient Education [employer].

## 2015-10-17 LAB — OB RESULTS CONSOLE ANTIBODY SCREEN: Antibody Screen: NEGATIVE

## 2015-10-17 LAB — OB RESULTS CONSOLE ABO/RH: RH Type: POSITIVE

## 2015-10-17 LAB — OB RESULTS CONSOLE RPR: RPR: NONREACTIVE

## 2015-10-17 LAB — OB RESULTS CONSOLE RUBELLA ANTIBODY, IGM: Rubella: IMMUNE

## 2015-10-17 LAB — OB RESULTS CONSOLE GC/CHLAMYDIA
CHLAMYDIA, DNA PROBE: NEGATIVE
Gonorrhea: NEGATIVE

## 2015-10-17 LAB — OB RESULTS CONSOLE HEPATITIS B SURFACE ANTIGEN: HEP B S AG: NEGATIVE

## 2015-10-17 LAB — OB RESULTS CONSOLE HIV ANTIBODY (ROUTINE TESTING): HIV: NONREACTIVE

## 2016-05-08 ENCOUNTER — Inpatient Hospital Stay (EMERGENCY_DEPARTMENT_HOSPITAL)
Admission: AD | Admit: 2016-05-08 | Discharge: 2016-05-08 | Disposition: A | Discharge status: Home / Self Care | Payer: Managed Care, Other (non HMO) | Source: Ambulatory Visit | Attending: Obstetrics and Gynecology | Admitting: Obstetrics and Gynecology

## 2016-05-08 ENCOUNTER — Encounter (HOSPITAL_COMMUNITY): Payer: Self-pay | Admitting: *Deleted

## 2016-05-08 DIAGNOSIS — D649 Anemia, unspecified: Secondary | ICD-10-CM

## 2016-05-08 DIAGNOSIS — O99013 Anemia complicating pregnancy, third trimester: Secondary | ICD-10-CM | POA: Diagnosis not present

## 2016-05-08 DIAGNOSIS — Z3A37 37 weeks gestation of pregnancy: Secondary | ICD-10-CM | POA: Insufficient documentation

## 2016-05-08 DIAGNOSIS — R030 Elevated blood-pressure reading, without diagnosis of hypertension: Secondary | ICD-10-CM | POA: Diagnosis not present

## 2016-05-08 DIAGNOSIS — O1493 Unspecified pre-eclampsia, third trimester: Secondary | ICD-10-CM | POA: Diagnosis not present

## 2016-05-08 DIAGNOSIS — O1494 Unspecified pre-eclampsia, complicating childbirth: Secondary | ICD-10-CM | POA: Diagnosis not present

## 2016-05-08 DIAGNOSIS — Z87891 Personal history of nicotine dependence: Secondary | ICD-10-CM | POA: Insufficient documentation

## 2016-05-08 DIAGNOSIS — E876 Hypokalemia: Secondary | ICD-10-CM

## 2016-05-08 DIAGNOSIS — O133 Gestational [pregnancy-induced] hypertension without significant proteinuria, third trimester: Secondary | ICD-10-CM

## 2016-05-08 LAB — CBC
HEMATOCRIT: 21.8 % — AB (ref 36.0–46.0)
HEMOGLOBIN: 6.6 g/dL — AB (ref 12.0–15.0)
MCH: 18.2 pg — AB (ref 26.0–34.0)
MCHC: 30.3 g/dL (ref 30.0–36.0)
MCV: 60.2 fL — ABNORMAL LOW (ref 78.0–100.0)
Platelets: 197 10*3/uL (ref 150–400)
RBC: 3.62 MIL/uL — AB (ref 3.87–5.11)
RDW: 19.5 % — ABNORMAL HIGH (ref 11.5–15.5)
WBC: 8.1 10*3/uL (ref 4.0–10.5)

## 2016-05-08 LAB — COMPREHENSIVE METABOLIC PANEL
ALK PHOS: 123 U/L (ref 38–126)
ALT: 8 U/L — AB (ref 14–54)
AST: 17 U/L (ref 15–41)
Albumin: 2.5 g/dL — ABNORMAL LOW (ref 3.5–5.0)
Anion gap: 8 (ref 5–15)
BILIRUBIN TOTAL: 1 mg/dL (ref 0.3–1.2)
BUN: <5 mg/dL — ABNORMAL LOW (ref 6–20)
CALCIUM: 8 mg/dL — AB (ref 8.9–10.3)
CO2: 22 mmol/L (ref 22–32)
CREATININE: 0.54 mg/dL (ref 0.44–1.00)
Chloride: 107 mmol/L (ref 101–111)
GFR calc non Af Amer: >60 mL/min (ref >60)
GLUCOSE: 126 mg/dL — AB (ref 65–99)
Potassium: 2.5 mmol/L — CL (ref 3.5–5.1)
SODIUM: 137 mmol/L (ref 135–145)
Total Protein: 6.2 g/dL — ABNORMAL LOW (ref 6.5–8.1)

## 2016-05-08 LAB — PROTEIN / CREATININE RATIO, URINE
Creatinine, Urine: 128 mg/dL
Protein Creatinine Ratio: 0.26 mg/mg{Cre} — ABNORMAL HIGH (ref 0.00–0.15)
TOTAL PROTEIN, URINE: 33 mg/dL

## 2016-05-08 MED ORDER — POTASSIUM CHLORIDE 2 MEQ/ML IV SOLN
INTRAVENOUS | Status: DC
  Administered 2016-05-08: 17:00:00 via INTRAVENOUS
  Filled 2016-05-08 (×2): qty 1000

## 2016-05-08 MED ORDER — ACETAMINOPHEN 500 MG PO TABS
1000.0000 mg | ORAL_TABLET | Freq: Once | ORAL | Status: AC
  Administered 2016-05-08: 1000 mg via ORAL
  Filled 2016-05-08: qty 2

## 2016-05-08 MED ORDER — POTASSIUM CHLORIDE CRYS ER 20 MEQ PO TBCR: 20.0000 meq | EXTENDED_RELEASE_TABLET | Freq: Two times a day (BID) | ORAL | 0 refills | Status: DC

## 2016-05-08 MED ORDER — FERUMOXYTOL INJECTION 510 MG/17 ML
510.0000 mg | Freq: Once | INTRAVENOUS | Status: AC
  Administered 2016-05-08: 510 mg via INTRAVENOUS
  Filled 2016-05-08: qty 17

## 2016-05-08 NOTE — MAU Provider Note (Signed)
History     CSN: [id number]  Arrival date and time: 05/08/16 1335   First Provider Initiated Contact with Patient 05/08/16 1422      Chief Complaint  Patient presents with  . Labor Eval   HPI April Lucas is a 30 y.o. [id number] at 45w5dwho presents for labor evaluation. I was asked to see patient d/t elevated BPs. Patient reports history of preeclampsia in previous pregnancy. Denies hypertension during this pregnancy. Reports headache today. Currently rates pain 8/10. Has not treated. Nothing makes better or worse. Describes as frontal dull pain.  Denies vision changes, epigastric pain, chest pain. Contractions have decreased in frequency & intensity since arriving to MAU.   OB History    Gravida Para Term Preterm AB Living   '4 2 1 1 1 2   '$ SAB TAB Ectopic Multiple Live Births   1       2      Past Medical History:  Diagnosis Date  . Anemia   . Anxiety   . Diabetes mellitus without complication (HCC)    GDM diet controlled; 2nd pg only  . Fibroid   . Hypertension    Gestational hypertension only  . Miscarriage 04/2015  . Obesity     Past Surgical History:  Procedure Laterality Date  . extraction of wisdom teeth      Family History  Problem Relation Age of Onset  . Hypertension Other   . Diabetes Other   . Hypertension Father   . Diabetes Father   . Other Neg Hx   . Hearing loss Neg Hx     Social History  Substance Use Topics  . Smoking status: Former SResearch scientist (life sciences) . Smokeless tobacco: Never Used     Comment: as teenager  . Alcohol use 1.8 oz/week    3 Glasses of wine per week     Comment: not with preg    Allergies: No Known Allergies  Prescriptions Prior to Admission  Medication Sig Dispense Refill Last Dose  . promethazine (PHENERGAN) 25 MG tablet Take 1 tablet (25 mg total) by mouth every 6 (six) hours as needed for nausea or vomiting. 30 tablet 0     Review of Systems  Constitutional: Negative.   Eyes: Negative for blurred vision.  Respiratory:  Negative for shortness of breath.   Cardiovascular: Negative for chest pain, palpitations and leg swelling.  Gastrointestinal: Positive for abdominal pain.  Genitourinary: Negative.   Neurological: Positive for headaches. Negative for dizziness.   Physical Exam   Blood pressure 160/96, pulse 108, temperature 98.5 F (36.9 C), resp. rate 18, last menstrual period 08/19/2015, unknown if currently breastfeeding.  Physical Exam  Nursing note and vitals reviewed. Constitutional: She is oriented to person, place, and time. She appears well-developed and well-nourished. No distress.  HENT:  Head: Normocephalic and atraumatic.  Eyes: Conjunctivae are normal. Right eye exhibits no discharge. Left eye exhibits no discharge. No scleral icterus.  Neck: Normal range of motion.  Cardiovascular: Normal rate, regular rhythm and normal heart sounds.   No murmur heard. Respiratory: Effort normal and breath sounds normal. No respiratory distress. She has no wheezes.  GI: Soft. There is no tenderness.  Neurological: She is alert and oriented to person, place, and time. She has normal reflexes.  No clonus  Skin: Skin is warm and dry. She is not diaphoretic.  Psychiatric: She has a normal mood and affect. Her behavior is normal. Judgment and thought content normal.   Fetal Tracing:  Baseline: 135  Variability: moderate Accelerations: 15x15 Decelerations: none  Toco: irregular ctx  Dilation: 2.5 Effacement (%): 50 Cervical Position: Posterior Station: -3 Presentation: Vertex Exam by:: K.Wilson,RN  MAU Course  Procedures Results for orders placed or performed during the hospital encounter of 05/08/16 (from the past 48 hour(s))  Protein / creatinine ratio, urine     Status: Abnormal   Collection Time: 05/08/16  2:05 PM  Result Value Ref Range   Creatinine, Urine 128.00 mg/dL   Total Protein, Urine 33 mg/dL    Comment: NO NORMAL RANGE ESTABLISHED FOR THIS TEST   Protein Creatinine Ratio 0.26  (H) 0.00 - 0.15 mg/mg[Cre]  CBC     Status: Abnormal   Collection Time: 05/08/16  2:23 PM  Result Value Ref Range   WBC 8.1 4.0 - 10.5 K/uL   RBC 3.62 (L) 3.87 - 5.11 MIL/uL   Hemoglobin 6.6 (LL) 12.0 - 15.0 g/dL    Comment: CRITICAL RESULT CALLED TO, READ BACK BY AND VERIFIED WITH: WILSON,K. @ 1519 ON 11.2.17 BY BOSTONC REPEATED TO VERIFY    HCT 21.8 (L) 36.0 - 46.0 %   MCV 60.2 (L) 78.0 - 100.0 fL    Comment: REPEATED TO VERIFY   MCH 18.2 (L) 26.0 - 34.0 pg   MCHC 30.3 30.0 - 36.0 g/dL   RDW 19.5 (H) 11.5 - 15.5 %   Platelets 197 150 - 400 K/uL  Comprehensive metabolic panel     Status: Abnormal   Collection Time: 05/08/16  2:23 PM  Result Value Ref Range   Sodium 137 135 - 145 mmol/L   Potassium 2.5 (LL) 3.5 - 5.1 mmol/L    Comment: NO VISIBLE HEMOLYSIS CRITICAL RESULT CALLED TO, READ BACK BY AND VERIFIED WITH: WILSON,K. @ 1552 ON 11.2.17 BY BOSTONC    Chloride 107 101 - 111 mmol/L   CO2 22 22 - 32 mmol/L   Glucose, Bld 126 (H) 65 - 99 mg/dL   BUN <5 (L) 6 - 20 mg/dL    Comment: REPEATED TO VERIFY   Creatinine, Ser 0.54 0.44 - 1.00 mg/dL   Calcium 8.0 (L) 8.9 - 10.3 mg/dL   Total Protein 6.2 (L) 6.5 - 8.1 g/dL   Albumin 2.5 (L) 3.5 - 5.0 g/dL   AST 17 15 - 41 U/L   ALT 8 (L) 14 - 54 U/L   Alkaline Phosphatase 123 38 - 126 U/L   Total Bilirubin 1.0 0.3 - 1.2 mg/dL   GFR calc non Af Amer >60 >60 mL/min   GFR calc Af Amer >60 >60 mL/min    Comment: (NOTE) The eGFR has been calculated using the CKD EPI equation. This calculation has not been validated in all clinical situations. eGFR's persistently <60 mL/min signify possible Chronic Kidney Disease.    Anion gap 8 5 - 15   MDM Reactive fetal tracing Pt had 1 severe range BP at beginning of visit; labs ordered; rest of BPs elevated but no further severe range  CBC, CMP, urine PCR S/w Dr. Matthew Saras regarding BPs & labs; including hemoglobin of 6.6 & potassium of 2.5. Will give IV feraheme while in MAU. Ok to discharge  home with rx for kdur. Office will call pt for f/u tomorrow and will manage scheduling of next dose of feraheme Assessment and Plan  A: 1. Pregnancy-induced hypertension in third trimester   2. Anemia during pregnancy in third trimester   3. Hypokalemia    P: Discharge home Rx kdur 44mq bid x 1 wk Office will call  in the morning for f/u appt Discussed reasons to return to [place] 05/08/2016, 2:21 PM

## 2016-05-08 NOTE — Discharge Instructions (Signed)
Anemia, Nonspecific Anemia is a condition in which the concentration of red blood cells or hemoglobin in the blood is below normal. Hemoglobin is a substance in red blood cells that carries oxygen to the tissues of the body. Anemia results in not enough oxygen reaching these tissues.  CAUSES  Common causes of anemia include:   Excessive bleeding. Bleeding may be internal or external. This includes excessive bleeding from periods (in women) or from the intestine.   Poor nutrition.   Chronic kidney, thyroid, and liver disease.  Bone marrow disorders that decrease red blood cell production.  Cancer and treatments for cancer.  HIV, AIDS, and their treatments.  Spleen problems that increase red blood cell destruction.  Blood disorders.  Excess destruction of red blood cells due to infection, medicines, and autoimmune disorders. SIGNS AND SYMPTOMS   Minor weakness.   Dizziness.   Headache.  Palpitations.   Shortness of breath, especially with exercise.   Paleness.  Cold sensitivity.  Indigestion.  Nausea.  Difficulty sleeping.  Difficulty concentrating. Symptoms may occur suddenly or they may develop slowly.  DIAGNOSIS  Additional blood tests are often needed. These help your health care provider determine the best treatment. Your health care provider will check your stool for blood and look for other causes of blood loss.  TREATMENT  Treatment varies depending on the cause of the anemia. Treatment can include:   Supplements of iron, vitamin 123456, or folic acid.   Hormone medicines.   A blood transfusion. This may be needed if blood loss is severe.   Hospitalization. This may be needed if there is significant continual blood loss.   Dietary changes.  Spleen removal. HOME CARE INSTRUCTIONS Keep all follow-up appointments. It often takes many weeks to correct anemia, and having your health care provider check on your condition and your response to  treatment is very important. SEEK IMMEDIATE MEDICAL CARE IF:   You develop extreme weakness, shortness of breath, or chest pain.   You become dizzy or have trouble concentrating.  You develop heavy vaginal bleeding.   You develop a rash.   You have bloody or black, tarry stools.   You faint.   You vomit up blood.   You vomit repeatedly.   You have abdominal pain.  You have a fever or persistent symptoms for more than 2-3 days.   You have a fever and your symptoms suddenly get worse.   You are dehydrated.  MAKE SURE YOU:  Understand these instructions.  Will watch your condition.  Will get help right away if you are not doing well or get worse.   This information is not intended to replace advice given to you by your health care provider. Make sure you discuss any questions you have with your health care provider.   Document Released: 07/31/2004 Document Revised: 02/23/2013 Document Reviewed: 12/17/2012 Elsevier Interactive Patient Education 2016 [employer]. Hypokalemia Hypokalemia means that the amount of potassium in the blood is lower than normal.Potassium is a chemical, called an electrolyte, that helps regulate the amount of fluid in the body. It also stimulates muscle contraction and helps nerves function properly.Most of the body's potassium is inside of cells, and only a very small amount is in the blood. Because the amount in the blood is so small, minor changes can be life-threatening. CAUSES  Antibiotics.  Diarrhea or vomiting.  Using laxatives too much, which can cause diarrhea.  Chronic kidney disease.  Water pills (diuretics).  Eating disorders (bulimia).  Low magnesium  level.  Sweating a lot. SIGNS AND SYMPTOMS  Weakness.  Constipation.  Fatigue.  Muscle cramps.  Mental confusion.  Skipped heartbeats or irregular heartbeat (palpitations).  Tingling or numbness. DIAGNOSIS  Your health care provider can diagnose  hypokalemia with blood tests. In addition to checking your potassium level, your health care provider may also check other lab tests. TREATMENT Hypokalemia can be treated with potassium supplements taken by mouth or adjustments in your current medicines. If your potassium level is very low, you may need to get potassium through a vein (IV) and be monitored in the hospital. A diet high in potassium is also helpful. Foods high in potassium are:  Nuts, such as peanuts and pistachios.  Seeds, such as sunflower seeds and pumpkin seeds.  Peas, lentils, and lima beans.  Whole grain and bran cereals and breads.  Fresh fruit and vegetables, such as apricots, avocado, bananas, cantaloupe, kiwi, oranges, tomatoes, asparagus, and potatoes.  Orange and tomato juices.  Red meats.  Fruit yogurt. HOME CARE INSTRUCTIONS  Take all medicines as prescribed by your health care provider.  Maintain a healthy diet by including nutritious food, such as fruits, vegetables, nuts, whole grains, and lean meats.  If you are taking a laxative, be sure to follow the directions on the label. SEEK MEDICAL CARE IF:  Your weakness gets worse.  You feel your heart pounding or racing.  You are vomiting or having diarrhea.  You are diabetic and having trouble keeping your blood glucose in the normal range. SEEK IMMEDIATE MEDICAL CARE IF:  You have chest pain, shortness of breath, or dizziness.  You are vomiting or having diarrhea for more than 2 days.  You faint. MAKE SURE YOU:   Understand these instructions.  Will watch your condition.  Will get help right away if you are not doing well or get worse.   This information is not intended to replace advice given to you by your health care provider. Make sure you discuss any questions you have with your health care provider.   Document Released: 06/23/2005 Document Revised: 07/14/2014 Document Reviewed: 12/24/2012 Elsevier Interactive Patient Education  2016 [employer]. Hypertension During Pregnancy Hypertension, or high blood pressure, is when there is extra pressure inside your blood vessels that carry blood from the heart to the rest of your body (arteries). It can happen at any time in life, including pregnancy. Hypertension during pregnancy can cause problems for you and your baby. Your baby might not weigh as much as he or she should at birth or might be born early (premature). Very bad cases of hypertension during pregnancy can be life-threatening.  Different types of hypertension can occur during pregnancy. These include:  Chronic hypertension. This happens when a woman has hypertension before pregnancy and it continues during pregnancy.  Gestational hypertension. This is when hypertension develops during pregnancy.  Preeclampsia or toxemia of pregnancy. This is a very serious type of hypertension that develops only during pregnancy. It affects the whole body and can be very dangerous for both mother and baby.  Gestational hypertension and preeclampsia usually go away after your baby is born. Your blood pressure will likely stabilize within 6 weeks. Women who have hypertension during pregnancy have a greater chance of developing hypertension later in life or with future pregnancies. RISK FACTORS There are certain factors that make it more likely for you to develop hypertension during pregnancy. These include:  Having hypertension before pregnancy.  Having hypertension during a previous pregnancy.  Being overweight.  Being older than 40 years.  Being pregnant with more than one baby.  Having diabetes or kidney problems. SIGNS AND SYMPTOMS Chronic and gestational hypertension rarely cause symptoms. Preeclampsia has symptoms, which may include:  Increased protein in your urine. Your health care provider will check for this at every prenatal visit.  Swelling of your hands and face.  Rapid weight gain.  Headaches.  Visual  changes.  Being bothered by light.  Abdominal pain, especially in the upper right area.  Chest pain.  Shortness of breath.  Increased reflexes.  Seizures. These occur with a more severe form of preeclampsia, called eclampsia. DIAGNOSIS  You may be diagnosed with hypertension during a regular prenatal exam. At each prenatal visit, you may have:  Your blood pressure checked.  A urine test to check for protein in your urine. The type of hypertension you are diagnosed with depends on when you developed it. It also depends on your specific blood pressure reading.  Developing hypertension before 20 weeks of pregnancy is consistent with chronic hypertension.  Developing hypertension after 20 weeks of pregnancy is consistent with gestational hypertension.  Hypertension with increased urinary protein is diagnosed as preeclampsia.  Blood pressure measurements that stay above 0000000 systolic or [id number] diastolic are a sign of severe preeclampsia. TREATMENT Treatment for hypertension during pregnancy varies. Treatment depends on the type of hypertension and how serious it is.  If you take medicine for chronic hypertension, you may need to switch medicines.  Medicines called ACE inhibitors should not be taken during pregnancy.  Low-dose aspirin may be suggested for women who have risk factors for preeclampsia.  If you have gestational hypertension, you may need to take a blood pressure medicine that is safe during pregnancy. Your health care provider will recommend the correct medicine.  If you have severe preeclampsia, you may need to be in the hospital. Health care providers will watch you and your baby very closely. You also may need to take medicine called magnesium sulfate to prevent seizures and lower blood pressure.  Sometimes, an early delivery is needed. This may be the case if the condition worsens. It would be done to protect you and your baby. The only cure for preeclampsia is  delivery.  Your health care provider may recommend that you take one low-dose aspirin (81 mg) each day to help prevent high blood pressure during your pregnancy if you are at risk for preeclampsia. You may be at risk for preeclampsia if:  You had preeclampsia or eclampsia during a previous pregnancy.  Your baby did not grow as expected during a previous pregnancy.  You experienced preterm birth with a previous pregnancy.  You experienced a separation of the placenta from the uterus (placental abruption) during a previous pregnancy.  You experienced the loss of your baby during a previous pregnancy.  You are pregnant with more than one baby.  You have other medical conditions, such as diabetes or an autoimmune disease. HOME CARE INSTRUCTIONS  Schedule and keep all of your regular prenatal care appointments. This is important.  Take medicines only as directed by your health care provider. Tell your health care provider about all medicines you take.  Eat as little salt as possible.  Get regular exercise.  Do not drink alcohol.  Do not use tobacco products.  Do not drink products with caffeine.  Lie on your left side when resting. SEEK IMMEDIATE MEDICAL CARE IF:  You have severe abdominal pain.  You have sudden swelling in your hands,  ankles, or face.  You gain 4 pounds (1.8 kg) or more in 1 week.  You vomit repeatedly.  You have vaginal bleeding.  You do not feel your baby moving as much.  You have a headache.  You have blurred or double vision.  You have muscle twitching or spasms.  You have shortness of breath.  You have blue fingernails or lips.  You have blood in your urine. MAKE SURE YOU:  Understand these instructions.  Will watch your condition.  Will get help right away if you are not doing well or get worse.   This information is not intended to replace advice given to you by your health care provider. Make sure you discuss any questions you  have with your health care provider.   Document Released: 03/11/2011 Document Revised: 07/14/2014 Document Reviewed: 01/20/2013 Elsevier Interactive Patient Education [employer].

## 2016-05-08 NOTE — Progress Notes (Signed)
CRITICAL VALUE ALERT  Critical value received:  Hgb 6.6  Date of notification:  05/08/16  Time of notification:  [id number]  Critical value read back:Yes.    Nurse who received alert:  K.Attikus Bartoszek,RN  MD notified (1st page):  E.Lawrence,NP  Time of first page:  1520  MD notified (2nd page):n/a   Time of second page:n/a  Responding MD:  E.Lawrence,NP  Time MD responded: 1520

## 2016-05-08 NOTE — MAU Note (Signed)
Pt reports she has been having ctx on and off since last night. Now about 5 min apart. Denies any vag bleeding or leaking. Good fetal movement reproted

## 2016-05-08 NOTE — Progress Notes (Signed)
CRITICAL VALUE ALERT  Critical value received:  K+ 2.5  Date of notification:  05/08/16  Time of notification:  [id number]  Critical value read back:Yes.    Nurse who received alert:  K.Jalayah Gutridge<RN  MD notified (1st page):  E.Lawrence,NP  Time of first page:  1554  MD notified (2nd page):n/a  Time of second page:n/a  Responding MD:  E.Lawrence,NP  Time MD responded:  [id number]

## 2016-05-09 ENCOUNTER — Encounter (HOSPITAL_COMMUNITY): Payer: Self-pay | Admitting: *Deleted

## 2016-05-09 ENCOUNTER — Telehealth (HOSPITAL_COMMUNITY): Payer: Self-pay | Admitting: *Deleted

## 2016-05-09 LAB — OB RESULTS CONSOLE GBS: STREP GROUP B AG: NEGATIVE

## 2016-05-09 NOTE — Telephone Encounter (Signed)
Preadmission screen  

## 2016-05-10 ENCOUNTER — Encounter (HOSPITAL_COMMUNITY): Payer: Self-pay | Admitting: Certified Nurse Midwife

## 2016-05-10 ENCOUNTER — Inpatient Hospital Stay (HOSPITAL_COMMUNITY)
Admission: AD | Admit: 2016-05-10 | Discharge: 2016-05-12 | DRG: 775 | Disposition: A | Discharge status: Home / Self Care | Payer: Managed Care, Other (non HMO) | Source: Ambulatory Visit | Attending: Obstetrics & Gynecology | Admitting: Obstetrics & Gynecology

## 2016-05-10 DIAGNOSIS — Z3A37 37 weeks gestation of pregnancy: Secondary | ICD-10-CM

## 2016-05-10 DIAGNOSIS — E876 Hypokalemia: Secondary | ICD-10-CM | POA: Diagnosis present

## 2016-05-10 DIAGNOSIS — Z833 Family history of diabetes mellitus: Secondary | ICD-10-CM | POA: Diagnosis not present

## 2016-05-10 DIAGNOSIS — O1493 Unspecified pre-eclampsia, third trimester: Secondary | ICD-10-CM

## 2016-05-10 DIAGNOSIS — D509 Iron deficiency anemia, unspecified: Secondary | ICD-10-CM | POA: Diagnosis present

## 2016-05-10 DIAGNOSIS — Z8249 Family history of ischemic heart disease and other diseases of the circulatory system: Secondary | ICD-10-CM

## 2016-05-10 DIAGNOSIS — O9902 Anemia complicating childbirth: Secondary | ICD-10-CM | POA: Diagnosis present

## 2016-05-10 DIAGNOSIS — O1490 Unspecified pre-eclampsia, unspecified trimester: Secondary | ICD-10-CM | POA: Diagnosis present

## 2016-05-10 DIAGNOSIS — Z3490 Encounter for supervision of normal pregnancy, unspecified, unspecified trimester: Secondary | ICD-10-CM

## 2016-05-10 DIAGNOSIS — Z87891 Personal history of nicotine dependence: Secondary | ICD-10-CM | POA: Diagnosis not present

## 2016-05-10 DIAGNOSIS — O1494 Unspecified pre-eclampsia, complicating childbirth: Principal | ICD-10-CM | POA: Diagnosis present

## 2016-05-10 DIAGNOSIS — O99284 Endocrine, nutritional and metabolic diseases complicating childbirth: Secondary | ICD-10-CM | POA: Diagnosis present

## 2016-05-10 DIAGNOSIS — R030 Elevated blood-pressure reading, without diagnosis of hypertension: Secondary | ICD-10-CM | POA: Diagnosis present

## 2016-05-10 LAB — URINALYSIS, ROUTINE W REFLEX MICROSCOPIC
BILIRUBIN URINE: NEGATIVE
GLUCOSE, UA: NEGATIVE mg/dL
HGB URINE DIPSTICK: NEGATIVE
KETONES UR: 15 mg/dL — AB
Nitrite: NEGATIVE
PH: 7 (ref 5.0–8.0)
PROTEIN: NEGATIVE mg/dL
Specific Gravity, Urine: 1.015 (ref 1.005–1.030)

## 2016-05-10 LAB — COMPREHENSIVE METABOLIC PANEL
ALBUMIN: 2.5 g/dL — AB (ref 3.5–5.0)
ALK PHOS: 134 U/L — AB (ref 38–126)
ALT: 8 U/L — ABNORMAL LOW (ref 14–54)
ANION GAP: 7 (ref 5–15)
AST: 14 U/L — ABNORMAL LOW (ref 15–41)
BUN: <5 mg/dL — ABNORMAL LOW (ref 6–20)
CALCIUM: 8.2 mg/dL — AB (ref 8.9–10.3)
CHLORIDE: 107 mmol/L (ref 101–111)
CO2: 24 mmol/L (ref 22–32)
Creatinine, Ser: 0.5 mg/dL (ref 0.44–1.00)
GFR calc non Af Amer: >60 mL/min (ref >60)
Glucose, Bld: 105 mg/dL — ABNORMAL HIGH (ref 65–99)
POTASSIUM: 2.9 mmol/L — AB (ref 3.5–5.1)
SODIUM: 138 mmol/L (ref 135–145)
Total Bilirubin: 1.1 mg/dL (ref 0.3–1.2)
Total Protein: 5.8 g/dL — ABNORMAL LOW (ref 6.5–8.1)

## 2016-05-10 LAB — PREPARE RBC (CROSSMATCH)

## 2016-05-10 LAB — CBC
HCT: 22 % — ABNORMAL LOW (ref 36.0–46.0)
Hemoglobin: 6.7 g/dL — CL (ref 12.0–15.0)
MCH: 18.3 pg — AB (ref 26.0–34.0)
MCHC: 30.5 g/dL (ref 30.0–36.0)
MCV: 60.1 fL — ABNORMAL LOW (ref 78.0–100.0)
PLATELETS: 217 10*3/uL (ref 150–400)
RBC: 3.66 MIL/uL — ABNORMAL LOW (ref 3.87–5.11)
RDW: 19.7 % — AB (ref 11.5–15.5)
WBC: 7.6 10*3/uL (ref 4.0–10.5)

## 2016-05-10 LAB — PROTEIN / CREATININE RATIO, URINE
Creatinine, Urine: 200 mg/dL
PROTEIN CREATININE RATIO: 0.28 mg/mg{creat} — AB (ref 0.00–0.15)
TOTAL PROTEIN, URINE: 56 mg/dL

## 2016-05-10 LAB — URINE MICROSCOPIC-ADD ON: RBC / HPF: NONE SEEN RBC/hpf (ref 0–5)

## 2016-05-10 MED ORDER — FLEET ENEMA 7-19 GM/118ML RE ENEM: 1.0000 | ENEMA | RECTAL | Status: DC | PRN

## 2016-05-10 MED ORDER — DIPHENHYDRAMINE HCL 50 MG/ML IJ SOLN: 12.5000 mg | INTRAMUSCULAR | Status: DC | PRN

## 2016-05-10 MED ORDER — OXYCODONE-ACETAMINOPHEN 5-325 MG PO TABS
2.0000 | ORAL_TABLET | ORAL | Status: DC | PRN
  Administered 2016-05-10: 2 via ORAL
  Filled 2016-05-10: qty 2

## 2016-05-10 MED ORDER — LACTATED RINGERS IV SOLN: 500.0000 mL | Freq: Once | INTRAVENOUS | Status: DC

## 2016-05-10 MED ORDER — PRENATAL MULTIVITAMIN CH
1.0000 | ORAL_TABLET | Freq: Every day | ORAL | Status: DC
  Administered 2016-05-11: 1 via ORAL
  Filled 2016-05-10: qty 1

## 2016-05-10 MED ORDER — ACETAMINOPHEN 325 MG PO TABS: 650.0000 mg | ORAL_TABLET | ORAL | Status: DC | PRN

## 2016-05-10 MED ORDER — SOD CITRATE-CITRIC ACID 500-334 MG/5ML PO SOLN: 30.0000 mL | ORAL | Status: DC | PRN

## 2016-05-10 MED ORDER — FENTANYL CITRATE (PF) 100 MCG/2ML IJ SOLN
50.0000 ug | INTRAMUSCULAR | Status: DC | PRN
  Administered 2016-05-10: 50 ug via INTRAVENOUS
  Filled 2016-05-10: qty 2

## 2016-05-10 MED ORDER — OXYTOCIN 40 UNITS IN LACTATED RINGERS INFUSION - SIMPLE MED
1.0000 m[IU]/min | INTRAVENOUS | Status: DC
  Administered 2016-05-10: 2 m[IU]/min via INTRAVENOUS
  Filled 2016-05-10: qty 1000

## 2016-05-10 MED ORDER — DIBUCAINE 1 % RE OINT
1.0000 "application " | TOPICAL_OINTMENT | RECTAL | Status: DC | PRN
  Filled 2016-05-10: qty 28

## 2016-05-10 MED ORDER — ACETAMINOPHEN 500 MG PO TABS
1000.0000 mg | ORAL_TABLET | Freq: Four times a day (QID) | ORAL | Status: DC | PRN
  Administered 2016-05-10: 1000 mg via ORAL
  Filled 2016-05-10: qty 2

## 2016-05-10 MED ORDER — BENZOCAINE-MENTHOL 20-0.5 % EX AERO
1.0000 "application " | INHALATION_SPRAY | CUTANEOUS | Status: DC | PRN
  Administered 2016-05-10: 1 via TOPICAL
  Filled 2016-05-10 (×3): qty 56

## 2016-05-10 MED ORDER — ONDANSETRON HCL 4 MG/2ML IJ SOLN: 4.0000 mg | Freq: Four times a day (QID) | INTRAMUSCULAR | Status: DC | PRN

## 2016-05-10 MED ORDER — OXYTOCIN BOLUS FROM INFUSION
500.0000 mL | Freq: Once | INTRAVENOUS | Status: AC
  Administered 2016-05-10: 500 mL via INTRAVENOUS

## 2016-05-10 MED ORDER — OXYTOCIN 40 UNITS IN LACTATED RINGERS INFUSION - SIMPLE MED: 2.5000 [IU]/h | INTRAVENOUS | Status: DC

## 2016-05-10 MED ORDER — LIDOCAINE HCL (PF) 1 % IJ SOLN
30.0000 mL | INTRAMUSCULAR | Status: DC | PRN
Start: 2016-05-10 — End: 2016-05-12
  Administered 2016-05-10: 30 mL via SUBCUTANEOUS
  Filled 2016-05-10: qty 30

## 2016-05-10 MED ORDER — SIMETHICONE 80 MG PO CHEW: 80.0000 mg | CHEWABLE_TABLET | ORAL | Status: DC | PRN

## 2016-05-10 MED ORDER — OXYCODONE-ACETAMINOPHEN 5-325 MG PO TABS: 1.0000 | ORAL_TABLET | ORAL | Status: DC | PRN

## 2016-05-10 MED ORDER — DIPHENHYDRAMINE HCL 25 MG PO CAPS
25.0000 mg | ORAL_CAPSULE | Freq: Four times a day (QID) | ORAL | Status: DC | PRN
Start: 2016-05-10 — End: 2016-05-12

## 2016-05-10 MED ORDER — ONDANSETRON HCL 4 MG PO TABS: 4.0000 mg | ORAL_TABLET | ORAL | Status: DC | PRN

## 2016-05-10 MED ORDER — DIPHENHYDRAMINE HCL 25 MG PO CAPS
25.0000 mg | ORAL_CAPSULE | Freq: Once | ORAL | Status: AC
  Administered 2016-05-10: 25 mg via ORAL
  Filled 2016-05-10: qty 1

## 2016-05-10 MED ORDER — LACTATED RINGERS IV SOLN: INTRAVENOUS | Status: DC

## 2016-05-10 MED ORDER — ZOLPIDEM TARTRATE 5 MG PO TABS: 5.0000 mg | ORAL_TABLET | Freq: Every evening | ORAL | Status: DC | PRN

## 2016-05-10 MED ORDER — LACTATED RINGERS IV SOLN: 500.0000 mL | INTRAVENOUS | Status: DC | PRN

## 2016-05-10 MED ORDER — SODIUM CHLORIDE 0.9 % IV SOLN
Freq: Once | INTRAVENOUS | Status: AC
  Administered 2016-05-10: 16:00:00 via INTRAVENOUS

## 2016-05-10 MED ORDER — ONDANSETRON HCL 4 MG/2ML IJ SOLN: 4.0000 mg | INTRAMUSCULAR | Status: DC | PRN

## 2016-05-10 MED ORDER — FENTANYL 2.5 MCG/ML BUPIVACAINE 1/10 % EPIDURAL INFUSION (WH - ANES): 14.0000 mL/h | INTRAMUSCULAR | Status: DC | PRN

## 2016-05-10 MED ORDER — OXYCODONE-ACETAMINOPHEN 5-325 MG PO TABS
1.0000 | ORAL_TABLET | ORAL | Status: DC | PRN
  Administered 2016-05-11: 1 via ORAL
  Filled 2016-05-10 (×3): qty 1

## 2016-05-10 MED ORDER — SENNOSIDES-DOCUSATE SODIUM 8.6-50 MG PO TABS
2.0000 | ORAL_TABLET | ORAL | Status: DC
  Administered 2016-05-12: 2 via ORAL
  Filled 2016-05-10: qty 2

## 2016-05-10 MED ORDER — EPHEDRINE 5 MG/ML INJ: 10.0000 mg | INTRAVENOUS | Status: DC | PRN

## 2016-05-10 MED ORDER — WITCH HAZEL-GLYCERIN EX PADS: 1.0000 "application " | MEDICATED_PAD | CUTANEOUS | Status: DC | PRN

## 2016-05-10 MED ORDER — OXYCODONE-ACETAMINOPHEN 5-325 MG PO TABS
2.0000 | ORAL_TABLET | ORAL | Status: DC | PRN
  Administered 2016-05-11 – 2016-05-12 (×6): 2 via ORAL
  Filled 2016-05-10 (×5): qty 2

## 2016-05-10 MED ORDER — PHENYLEPHRINE 40 MCG/ML (10ML) SYRINGE FOR IV PUSH (FOR BLOOD PRESSURE SUPPORT): 80.0000 ug | PREFILLED_SYRINGE | INTRAVENOUS | Status: DC | PRN

## 2016-05-10 MED ORDER — IBUPROFEN 600 MG PO TABS
600.0000 mg | ORAL_TABLET | Freq: Four times a day (QID) | ORAL | Status: DC
  Administered 2016-05-10 – 2016-05-12 (×6): 600 mg via ORAL
  Filled 2016-05-10 (×6): qty 1

## 2016-05-10 MED ORDER — COCONUT OIL OIL
1.0000 "application " | TOPICAL_OIL | Status: DC | PRN
  Filled 2016-05-10: qty 120

## 2016-05-10 MED ORDER — TERBUTALINE SULFATE 1 MG/ML IJ SOLN: 0.2500 mg | Freq: Once | INTRAMUSCULAR | Status: DC | PRN

## 2016-05-10 MED ORDER — TETANUS-DIPHTH-ACELL PERTUSSIS 5-2.5-18.5 LF-MCG/0.5 IM SUSP
0.5000 mL | Freq: Once | INTRAMUSCULAR | Status: DC
  Filled 2016-05-10: qty 0.5

## 2016-05-10 NOTE — Progress Notes (Signed)
Patient feeling increasing intensity with CTX. SVE 3-4/50/-2 with head well applied AROM clear.  Linda Hedges, DO

## 2016-05-10 NOTE — H&P (Signed)
April Lucas is a 30 y.o. female presenting for evaluation of elevated BP and HA; no CP/SOB, RUQ pain, or visual disturbance.  The patient has a h/o pre-eclampsia in her last pregnancy and has been monitored for new elevated BP this pregnancy over the last week.  Today, she has HA non-responsive to Tylenol and mild range BPs in MAU.  Antepartum course complicated by anemia with Hgb 6.6 two days ago with Fe infusion given.  She has h/o PTD at 36 weeks and has received 17-p this pregnancy. Active FM, rare CTX, no VB or LOF. Negative GBS.  OB History    Gravida Para Term Preterm AB Living   4 2 1 1 1 2    SAB TAB Ectopic Multiple Live Births   1       2     Past Medical History:  Diagnosis Date  . Anemia   . Anxiety   . Diabetes mellitus without complication (HCC)    GDM diet controlled; 2nd pg only  . Fibroid   . Hx of varicella   . Hypertension    Gestational hypertension only  . Miscarriage 04/2015  . Obesity    Past Surgical History:  Procedure Laterality Date  . extraction of wisdom teeth     Family History: family history includes Diabetes in her father and other; Hypertension in her father and other. Social History:  reports that she has quit smoking. She has never used smokeless tobacco. She reports that she does not drink alcohol or use drugs.     Maternal Diabetes: No Genetic Screening: Normal Maternal Ultrasounds/Referrals: Normal Fetal Ultrasounds or other Referrals:  None Maternal Substance Abuse:  No Significant Maternal Medications:  Meds include: Progesterone Other: iron Significant Maternal Lab Results:  Lab values include: Group B Strep negative Other Comments:  None  ROS Maternal Medical History:  Fetal activity: Perceived fetal activity is normal.   Last perceived fetal movement was within the past hour.    Prenatal complications: Pre-eclampsia.   Prenatal Complications - Diabetes: none.    Dilation: 3 Effacement (%): 50 Station: -3 Exam by::  dr Lynnette Caffey Blood pressure 131/78, pulse 98, temperature 97.6 F (36.4 C), temperature source Oral, resp. rate 18, weight 253 lb 4 oz (114.9 kg), last menstrual period 08/19/2015, unknown if currently breastfeeding. Maternal Exam:  Uterine Assessment: Contraction strength is mild.  Contraction frequency is rare.   Abdomen: Patient reports no abdominal tenderness. Fundal height is c/w dates.   Estimated fetal weight is 7#8.   Fetal presentation: vertex  Introitus: Normal vulva. Pelvis: adequate for delivery.   Cervix: Cervix evaluated by digital exam.     Physical Exam  Constitutional: She is oriented to person, place, and time. She appears well-developed and well-nourished.  GI: Soft. There is no rebound and no guarding.  Musculoskeletal: She exhibits no edema.  Neurological: She is alert and oriented to person, place, and time.  Skin: Skin is warm and dry.  Psychiatric: She has a normal mood and affect. Her behavior is normal.    Prenatal labs: ABO, Rh: O/Positive/-- (04/12 0000) Antibody: Negative (04/12 0000) Rubella: Immune (04/12 0000) RPR: Nonreactive (04/12 0000)  HBsAg: Negative (04/12 0000)  HIV: Non-reactive (04/12 0000)  GBS: Negative (11/03 0000)   Assessment/Plan: [id number] at [redacted]w[redacted]d for IOL for pre-eclampsia -Will start pitocin and AROM when head well applied -Epidural when ready -Chronic anemia-Hgb 6.7.  Patient is counseled for blood transfusion to prevent postpartum complications with anticipated blood loss related to delivery.  She is informed of the risk of transfusion reaction and viral transmission (HIV, Hep B/C).  All questions were answered and the patient will proceed.   Abayomi Pattison, [place] 05/10/2016, 2:03 PM

## 2016-05-10 NOTE — MAU Provider Note (Signed)
History     CSN: [id number]  Arrival date and time: 05/10/16 1137   None     Chief Complaint  Patient presents with  . Headache   [id number] @37 .6 weeks here with HA for several days. She reports as constant pain in frontal region and behind eyes. Until yesterday the HA would resolve with Tylenol. Last dose of Tylenol was yesterday. She denies visual disturbances, epigastric pain, SOB, and CP. She reports good FM. No VB, LOF, or ctx. She was seen in MAU 2 days ago for pre-e workup.    OB History    Gravida Para Term Preterm AB Living   4 2 1 1 1 2    SAB TAB Ectopic Multiple Live Births   1       2      Past Medical History:  Diagnosis Date  . Anemia   . Anxiety   . Diabetes mellitus without complication (HCC)    GDM diet controlled; 2nd pg only  . Fibroid   . Hx of varicella   . Hypertension    Gestational hypertension only  . Miscarriage 04/2015  . Obesity     Past Surgical History:  Procedure Laterality Date  . extraction of wisdom teeth      Family History  Problem Relation Age of Onset  . Hypertension Other   . Diabetes Other   . Hypertension Father   . Diabetes Father   . Other Neg Hx   . Hearing loss Neg Hx     Social History  Substance Use Topics  . Smoking status: Former Research scientist (life sciences)  . Smokeless tobacco: Never Used     Comment: as teenager  . Alcohol use No     Comment: not with preg    Allergies: No Known Allergies  Prescriptions Prior to Admission  Medication Sig Dispense Refill Last Dose  . potassium chloride SA (K-DUR,KLOR-CON) 20 MEQ tablet Take 1 tablet (20 mEq total) by mouth 2 (two) times daily. 14 tablet 0   . Prenatal MV-Min-FA-Omega-3 (PRENATAL GUMMIES/DHA & FA) 0.4-32.5 MG CHEW Chew 1 each by mouth daily.   05/07/2016 at Unknown time    Review of Systems  Constitutional: Negative.   Eyes: Negative.   Cardiovascular: Negative.   Gastrointestinal: Negative.   Neurological: Positive for headaches.   Physical Exam   Blood  pressure 153/89, pulse 98, temperature 97.6 F (36.4 C), temperature source Oral, resp. rate 18, weight 114.9 kg (253 lb 4 oz), last menstrual period 08/19/2015, unknown if currently breastfeeding. Vitals:   05/10/16 1215 05/10/16 1230 05/10/16 1245 05/10/16 1406  BP: 153/89 145/90 131/78   Pulse: 98 92 98   Resp:      Temp:      TempSrc:      Weight:    114.8 kg (253 lb)  Height:    5\' 3"  (1.6 m)    Physical Exam  Constitutional: She is oriented to person, place, and time. She appears well-developed and well-nourished. No distress.  HENT:  Head: Normocephalic and atraumatic.  Neck: Normal range of motion. Neck supple.  Cardiovascular: Normal rate and regular rhythm.   Respiratory: Effort normal and breath sounds normal.  GI: Soft. She exhibits no distension. There is no tenderness.  gravid  Musculoskeletal: Normal range of motion. She exhibits edema (1+ BLE).  Neurological: She is alert and oriented to person, place, and time. She has normal reflexes.  Skin: Skin is warm and dry.  Psychiatric: She has a normal mood and affect.  EFM: 135 bpm, mod variability, + accels, no decels Toco: none  Results for orders placed or performed during the hospital encounter of 05/10/16 (from the past 24 hour(s))  Urinalysis, Routine w reflex microscopic (not at [facility])     Status: Abnormal   Collection Time: 05/10/16 11:48 AM  Result Value Ref Range   Color, Urine YELLOW YELLOW   APPearance CLEAR CLEAR   Specific Gravity, Urine 1.015 1.005 - 1.030   pH 7.0 5.0 - 8.0   Glucose, UA NEGATIVE NEGATIVE mg/dL   Hgb urine dipstick NEGATIVE NEGATIVE   Bilirubin Urine NEGATIVE NEGATIVE   Ketones, ur 15 (A) NEGATIVE mg/dL   Protein, ur NEGATIVE NEGATIVE mg/dL   Nitrite NEGATIVE NEGATIVE   Leukocytes, UA SMALL (A) NEGATIVE  Urine microscopic-add on     Status: Abnormal   Collection Time: 05/10/16 11:48 AM  Result Value Ref Range   Squamous Epithelial / LPF TOO NUMEROUS TO COUNT (A) NONE SEEN    WBC, UA 6-30 0 - 5 WBC/hpf   RBC / HPF NONE SEEN 0 - 5 RBC/hpf   Bacteria, UA MANY (A) NONE SEEN   Urine-Other MUCOUS PRESENT   CBC     Status: Abnormal   Collection Time: 05/10/16 12:03 PM  Result Value Ref Range   WBC 7.6 4.0 - 10.5 K/uL   RBC 3.66 (L) 3.87 - 5.11 MIL/uL   Hemoglobin 6.7 (LL) 12.0 - 15.0 g/dL   HCT 22.0 (L) 36.0 - 46.0 %   MCV 60.1 (L) 78.0 - 100.0 fL   MCH 18.3 (L) 26.0 - 34.0 pg   MCHC 30.5 30.0 - 36.0 g/dL   RDW 19.7 (H) 11.5 - 15.5 %   Platelets 217 150 - 400 K/uL  Comprehensive metabolic panel     Status: Abnormal   Collection Time: 05/10/16 12:03 PM  Result Value Ref Range   Sodium 138 135 - 145 mmol/L   Potassium 2.9 (L) 3.5 - 5.1 mmol/L   Chloride 107 101 - 111 mmol/L   CO2 24 22 - 32 mmol/L   Glucose, Bld 105 (H) 65 - 99 mg/dL   BUN <5 (L) 6 - 20 mg/dL   Creatinine, Ser 0.50 0.44 - 1.00 mg/dL   Calcium 8.2 (L) 8.9 - 10.3 mg/dL   Total Protein 5.8 (L) 6.5 - 8.1 g/dL   Albumin 2.5 (L) 3.5 - 5.0 g/dL   AST 14 (L) 15 - 41 U/L   ALT 8 (L) 14 - 54 U/L   Alkaline Phosphatase 134 (H) 38 - 126 U/L   Total Bilirubin 1.1 0.3 - 1.2 mg/dL   GFR calc non Af Amer >60 >60 mL/min   GFR calc Af Amer >60 >60 mL/min   Anion gap 7 5 - 15  Protein / creatinine ratio, urine     Status: Abnormal   Collection Time: 05/10/16 12:20 PM  Result Value Ref Range   Creatinine, Urine 200.00 mg/dL   Total Protein, Urine 56 mg/dL   Protein Creatinine Ratio 0.28 (H) 0.00 - 0.15 mg/mg[Cre]    MAU Course  Procedures Tylenol 1 gm po x1  MDM Labs ordered and reviewed. Presentation and clinical findings discussed with Dr. Lynnette Caffey. Plan for admit and IOL.  Assessment and Plan  37.[redacted] weeks gestation Preeclampsia  Reactive NST  Admit Management per Dr. Jola Schmidt, CNM 05/10/2016, 12:19 PM

## 2016-05-10 NOTE — Progress Notes (Signed)
Pt up to bathroom.

## 2016-05-10 NOTE — MAU Note (Signed)
Went to MD for BP check, was still up.  Had headache, was instructed to take Tylenol.  Has been taking Tylenol and it is not helping. Was told to come to MAU  If persisted.  Reports increase in swelling, denies visual changes. Denies epigastric pain.

## 2016-05-10 NOTE — Anesthesia Pain Management Evaluation Note (Signed)
  CRNA Pain Management Visit Note  Patient: April Lucas, 30 y.o., female  "Hello I am a member of the anesthesia team at [facility]. We have an anesthesia team available at all times to provide care throughout the hospital, including epidural management and anesthesia for C-section. I don't know your plan for the delivery whether it a natural birth, water birth, IV sedation, nitrous supplementation, doula or epidural, but we want to meet your pain goals."   1.Was your pain managed to your expectations on prior hospitalizations?   Yes   2.What is your expectation for pain management during this hospitalization?     Epidural  3.How can we help you reach that goal? Epidural  Record the patient's initial score and the patient's pain goal.   Pain: 4  Pain Goal: 8 [facility] wants you to be able to say your pain was always managed very well.  Kagen Kunath 05/10/2016

## 2016-05-11 LAB — CBC
HCT: 23.2 % — ABNORMAL LOW (ref 36.0–46.0)
Hemoglobin: 7.7 g/dL — ABNORMAL LOW (ref 12.0–15.0)
MCH: 21.3 pg — AB (ref 26.0–34.0)
MCHC: 33.2 g/dL (ref 30.0–36.0)
MCV: 64.1 fL — AB (ref 78.0–100.0)
PLATELETS: 188 10*3/uL (ref 150–400)
RBC: 3.62 MIL/uL — ABNORMAL LOW (ref 3.87–5.11)
RDW: 24 % — ABNORMAL HIGH (ref 11.5–15.5)
WBC: 10.3 10*3/uL (ref 4.0–10.5)

## 2016-05-11 LAB — COMPREHENSIVE METABOLIC PANEL
ALK PHOS: 103 U/L (ref 38–126)
ALT: 8 U/L — ABNORMAL LOW (ref 14–54)
ANION GAP: 8 (ref 5–15)
AST: 18 U/L (ref 15–41)
Albumin: 2.4 g/dL — ABNORMAL LOW (ref 3.5–5.0)
BUN: <5 mg/dL — ABNORMAL LOW (ref 6–20)
CALCIUM: 8 mg/dL — AB (ref 8.9–10.3)
CHLORIDE: 102 mmol/L (ref 101–111)
CO2: 22 mmol/L (ref 22–32)
Creatinine, Ser: 0.57 mg/dL (ref 0.44–1.00)
Glucose, Bld: 81 mg/dL (ref 65–99)
Potassium: 2.8 mmol/L — ABNORMAL LOW (ref 3.5–5.1)
SODIUM: 132 mmol/L — AB (ref 135–145)
Total Bilirubin: 1.1 mg/dL (ref 0.3–1.2)
Total Protein: 5.6 g/dL — ABNORMAL LOW (ref 6.5–8.1)

## 2016-05-11 LAB — TYPE AND SCREEN
ABO/RH(D): O POS
ANTIBODY SCREEN: NEGATIVE
UNIT DIVISION: 0
UNIT DIVISION: 0

## 2016-05-11 LAB — RPR: RPR Ser Ql: NONREACTIVE

## 2016-05-11 MED ORDER — FERROUS SULFATE 325 (65 FE) MG PO TABS
325.0000 mg | ORAL_TABLET | Freq: Three times a day (TID) | ORAL | Status: DC
  Administered 2016-05-11 – 2016-05-12 (×3): 325 mg via ORAL
  Filled 2016-05-11 (×3): qty 1

## 2016-05-11 MED ORDER — POTASSIUM CHLORIDE CRYS ER 20 MEQ PO TBCR
40.0000 meq | EXTENDED_RELEASE_TABLET | Freq: Two times a day (BID) | ORAL | Status: AC
  Administered 2016-05-11 (×2): 40 meq via ORAL
  Filled 2016-05-11 (×2): qty 2

## 2016-05-11 NOTE — Progress Notes (Signed)
Notified Dr. Lynnette Caffey of BP 146/81. Doctor changed notification parameters to 160/110s. Will continue to monitor.

## 2016-05-11 NOTE — Progress Notes (Signed)
Post Partum Day 1 Subjective: no complaints, up ad lib, voiding, tolerating PO and + flatus.  No HA, CP/SOB, RUQ pain or visual disturbance.  Objective: Blood pressure 134/67, pulse 69, temperature 97.6 F (36.4 C), temperature source Oral, resp. rate 18, height 5\' 3"  (1.6 m), weight 253 lb (114.8 kg), last menstrual period 08/19/2015, unknown if currently breastfeeding.  Physical Exam:  General: alert, cooperative and appears stated age Lochia: appropriate Uterine Fundus: firm Incision: healing well, no significant drainage, no dehiscence, no significant erythema DVT Evaluation: No evidence of DVT seen on physical exam. Negative Homan's sign. No cords or calf tenderness.   Recent Labs  05/10/16 1203 05/11/16 0416  HGB 6.7* 7.7*  HCT 22.0* 23.2*    Assessment/Plan: Plan for discharge tomorrow and Breastfeeding  Chronic anemia-transfused 2u PRBCs intra- and postpartum.  FeSO4 started. Pre-eclampsia-continue to monitor BP and add medication as needed.   Hypokalemia-replete today and recheck tomorrow.   LOS: 1 day   Jamieson Lisa 05/11/2016, 9:53 AM

## 2016-05-11 NOTE — Progress Notes (Addendum)
Mother Baby Rn recovering patient in L&D.  RN called Dr. Lynnette Caffey regarding patient's blood pressure of which systolic Blood Pressure ranging from 142 To 160.  Diastolic consistently WNL. Orders given while caring for patient in L&D, were to call Dr  if blood pressures were 160/110 X2 within 10 minutes.

## 2016-05-11 NOTE — Lactation Note (Deleted)
This note was copied from a baby's chart. Lactation Consultation Note  Baby 61 hours old.  Ex BF 1 year. Baby DAT+. Mother has semi flat nipples. Baby has recessed chin and is tongue thrusting. Mother easily expressed drops of colostrum.  Mother states last night baby sustained latch longer. Provided mother w/ manual pump to prepump to help evert nipples. Mother attempted in cross cradle. Repositioned baby to football hold. With compression mother was able to latch baby. A few swallows observed but lots of off and on. Nipple was slightly compressed after latch. Encouraged depth. Recommend mother hand express into spoon before or after feedings and give to baby to increase volume. Mom encouraged to feed baby 8-12 times/24 hours and with feeding cues.  Mom made aware of O/P services, breastfeeding support groups, community resources, and our phone # for post-discharge questions.     Patient Name: April Lucas Today's Date: 05/11/2016 Reason for consult: Initial assessment   Maternal Data Has patient been taught Hand Expression?: Yes Does the patient have breastfeeding experience prior to this delivery?: Yes  Feeding Feeding Type: Breast Fed Length of feed: 15 min  LATCH Score/Interventions Latch: Repeated attempts needed to sustain latch, nipple held in mouth throughout feeding, stimulation needed to elicit sucking reflex.  Audible Swallowing: A few with stimulation  Type of Nipple: Everted at rest and after stimulation (semi flat)  Comfort (Breast/Nipple): Soft / non-tender     Hold (Positioning): Assistance needed to correctly position infant at breast and maintain latch.  LATCH Score: 7  Lactation Tools Discussed/Used     Consult Status Consult Status: Follow-up Date: 05/12/16 Follow-up type: In-patient    Vivianne Master [facility] 05/11/2016, 12:26 PM

## 2016-05-12 LAB — COMPREHENSIVE METABOLIC PANEL
ALBUMIN: 2.3 g/dL — AB (ref 3.5–5.0)
ALT: 15 U/L (ref 14–54)
ANION GAP: 6 (ref 5–15)
AST: 28 U/L (ref 15–41)
Alkaline Phosphatase: 99 U/L (ref 38–126)
BILIRUBIN TOTAL: 0.6 mg/dL (ref 0.3–1.2)
BUN: 7 mg/dL (ref 6–20)
CHLORIDE: 106 mmol/L (ref 101–111)
CO2: 24 mmol/L (ref 22–32)
Calcium: 8 mg/dL — ABNORMAL LOW (ref 8.9–10.3)
Creatinine, Ser: 0.57 mg/dL (ref 0.44–1.00)
GFR calc Af Amer: >60 mL/min (ref >60)
GFR calc non Af Amer: >60 mL/min (ref >60)
GLUCOSE: 68 mg/dL (ref 65–99)
POTASSIUM: 3.4 mmol/L — AB (ref 3.5–5.1)
SODIUM: 136 mmol/L (ref 135–145)
TOTAL PROTEIN: 5.2 g/dL — AB (ref 6.5–8.1)

## 2016-05-12 MED ORDER — FERROUS SULFATE 325 (65 FE) MG PO TABS: 325.0000 mg | ORAL_TABLET | Freq: Three times a day (TID) | ORAL | 3 refills | Status: AC

## 2016-05-12 MED ORDER — IBUPROFEN 600 MG PO TABS: 600.0000 mg | ORAL_TABLET | Freq: Four times a day (QID) | ORAL | 1 refills | Status: DC

## 2016-05-12 MED ORDER — OXYCODONE-ACETAMINOPHEN 5-325 MG PO TABS: 1.0000 | ORAL_TABLET | ORAL | 0 refills | Status: DC | PRN

## 2016-05-12 NOTE — Lactation Note (Addendum)
This note was copied from a baby's chart. Lactation Consultation Note  Patient Name: April Lucas Today's Date: 05/12/2016 Reason for consult: Initial assessment Mom experienced BF and reports baby is nursing well, denies discomfort. Mom reports baby cluster fed during the night. Baby has been to breast 13 times in past 24 hours for 10-15 minutes. 4 voids/8 stools. Advised baby should be at breast 8-12 times in 24 hours for 15-30 minutes, both breasts some feedings. Lactation brochure left for review, advised of OP services and support group. Encouraged to call for questions/concerned.   Maternal Data Has patient been taught Hand Expression?: Yes Does the patient have breastfeeding experience prior to this delivery?: Yes  Feeding Feeding Type: Breast Fed Length of feed: 15 min  LATCH Score/Interventions Latch: Grasps breast easily, tongue down, lips flanged, rhythmical sucking.  Audible Swallowing: A few with stimulation  Type of Nipple: Everted at rest and after stimulation  Comfort (Breast/Nipple): Soft / non-tender     Hold (Positioning): No assistance needed to correctly position infant at breast. Intervention(s): Breastfeeding basics reviewed;Support Pillows;Position options;Skin to skin  LATCH Score: 9  Lactation Tools Discussed/Used WIC Program: No   Consult Status Consult Status: Complete Date: 05/12/16 Follow-up type: In-patient    Katrine Coho 05/12/2016, 9:16 AM

## 2016-05-12 NOTE — Discharge Summary (Signed)
Obstetric Discharge Summary Reason for Admission: induction of labor Prenatal Procedures: ultrasound Intrapartum Procedures: spontaneous vaginal delivery Postpartum Procedures: none Complications-Operative and Postpartum: 2 degree perineal laceration Hemoglobin  Date Value Ref Range Status  05/11/2016 7.7 (L) 12.0 - 15.0 g/dL Final   HCT  Date Value Ref Range Status  05/11/2016 23.2 (L) 36.0 - 46.0 % Final    Physical Exam:  General: alert and cooperative Lochia: appropriate Uterine Fundus: firm Incision: healing well DVT Evaluation: No evidence of DVT seen on physical exam. Negative Homan's sign. No cords or calf tenderness. Calf/Ankle edema is present.  Discharge Diagnoses: Term Pregnancy-delivered  Discharge Information: Date: 05/12/2016 Activity: pelvic rest Diet: routine Medications: PNV, Ibuprofen, Iron and Percocet Condition: stable Instructions: refer to practice specific booklet Discharge to: home   Newborn Data: Live born female  Birth Weight: 7 lb 9.3 oz (3440 g) APGAR: 9, 9  Home with mother.  Elaynah Virginia G 05/12/2016, 8:29 AM

## 2016-05-15 ENCOUNTER — Inpatient Hospital Stay (HOSPITAL_COMMUNITY): Admission: RE | Admit: 2016-05-15 | Payer: Managed Care, Other (non HMO) | Source: Ambulatory Visit

## 2016-05-19 ENCOUNTER — Inpatient Hospital Stay (HOSPITAL_COMMUNITY): Payer: Managed Care, Other (non HMO)

## 2016-05-19 ENCOUNTER — Inpatient Hospital Stay (HOSPITAL_COMMUNITY)
Admission: AD | Admit: 2016-05-19 | Discharge: 2016-05-21 | DRG: 776 | Disposition: A | Discharge status: Home / Self Care | Payer: Managed Care, Other (non HMO) | Source: Ambulatory Visit | Attending: Obstetrics and Gynecology | Admitting: Obstetrics and Gynecology

## 2016-05-19 DIAGNOSIS — O9903 Anemia complicating the puerperium: Secondary | ICD-10-CM | POA: Diagnosis present

## 2016-05-19 DIAGNOSIS — O152 Eclampsia in the puerperium: Secondary | ICD-10-CM | POA: Diagnosis present

## 2016-05-19 DIAGNOSIS — D649 Anemia, unspecified: Secondary | ICD-10-CM | POA: Diagnosis present

## 2016-05-19 DIAGNOSIS — R569 Unspecified convulsions: Secondary | ICD-10-CM

## 2016-05-19 DIAGNOSIS — O159 Eclampsia, unspecified as to time period: Secondary | ICD-10-CM | POA: Diagnosis present

## 2016-05-19 LAB — COMPREHENSIVE METABOLIC PANEL
ALT: 21 U/L (ref 14–54)
AST: 19 U/L (ref 15–41)
Albumin: 3.3 g/dL — ABNORMAL LOW (ref 3.5–5.0)
Alkaline Phosphatase: 95 U/L (ref 38–126)
Anion gap: 11 (ref 5–15)
BILIRUBIN TOTAL: 1.1 mg/dL (ref 0.3–1.2)
BUN: 5 mg/dL — AB (ref 6–20)
CO2: 22 mmol/L (ref 22–32)
CREATININE: 0.57 mg/dL (ref 0.44–1.00)
Calcium: 8.2 mg/dL — ABNORMAL LOW (ref 8.9–10.3)
Chloride: 107 mmol/L (ref 101–111)
GFR calc Af Amer: >60 mL/min (ref >60)
Glucose, Bld: 80 mg/dL (ref 65–99)
POTASSIUM: 3.3 mmol/L — AB (ref 3.5–5.1)
Sodium: 140 mmol/L (ref 135–145)
TOTAL PROTEIN: 7.4 g/dL (ref 6.5–8.1)

## 2016-05-19 LAB — CBC WITH DIFFERENTIAL/PLATELET
BASOS PCT: 0 %
Basophils Absolute: 0 10*3/uL (ref 0.0–0.1)
EOS PCT: 0 %
Eosinophils Absolute: 0 10*3/uL (ref 0.0–0.7)
HEMATOCRIT: 34.3 % — AB (ref 36.0–46.0)
Hemoglobin: 10.6 g/dL — ABNORMAL LOW (ref 12.0–15.0)
Lymphocytes Relative: 10 %
Lymphs Abs: 1.2 10*3/uL (ref 0.7–4.0)
MCH: 20.6 pg — ABNORMAL LOW (ref 26.0–34.0)
MCHC: 30.9 g/dL (ref 30.0–36.0)
MCV: 66.7 fL — AB (ref 78.0–100.0)
MONOS PCT: 2 %
Monocytes Absolute: 0.2 10*3/uL (ref 0.1–1.0)
NEUTROS ABS: 10.6 10*3/uL — AB (ref 1.7–7.7)
Neutrophils Relative %: 88 %
Other: 0 %
Platelets: 291 10*3/uL (ref 150–400)
RBC: 5.14 MIL/uL — AB (ref 3.87–5.11)
RDW: 27.5 % — AB (ref 11.5–15.5)
WBC: 12 10*3/uL — AB (ref 4.0–10.5)

## 2016-05-19 MED ORDER — MAGNESIUM SULFATE 2 GM/50ML IV SOLN
2.0000 g | Freq: Once | INTRAVENOUS | Status: DC
  Filled 2016-05-19: qty 50

## 2016-05-19 MED ORDER — HYDRALAZINE HCL 20 MG/ML IJ SOLN
5.0000 mg | INTRAMUSCULAR | Status: DC | PRN
  Administered 2016-05-21: 5 mg via INTRAVENOUS
  Filled 2016-05-19: qty 1

## 2016-05-19 MED ORDER — TRAMADOL HCL 50 MG PO TABS
50.0000 mg | ORAL_TABLET | Freq: Four times a day (QID) | ORAL | Status: DC | PRN
  Administered 2016-05-19 – 2016-05-21 (×6): 50 mg via ORAL
  Filled 2016-05-19 (×6): qty 1

## 2016-05-19 MED ORDER — IBUPROFEN 600 MG PO TABS
600.0000 mg | ORAL_TABLET | Freq: Four times a day (QID) | ORAL | Status: DC | PRN
  Administered 2016-05-19 – 2016-05-20 (×2): 600 mg via ORAL
  Filled 2016-05-19 (×3): qty 1

## 2016-05-19 MED ORDER — MENTHOL 3 MG MT LOZG: 1.0000 | LOZENGE | OROMUCOSAL | Status: DC | PRN

## 2016-05-19 MED ORDER — MAGNESIUM SULFATE 50 % IJ SOLN
2.0000 g/h | INTRAVENOUS | Status: AC
  Administered 2016-05-20: 2 g/h via INTRAVENOUS
  Filled 2016-05-19 (×2): qty 80

## 2016-05-19 MED ORDER — LABETALOL HCL 5 MG/ML IV SOLN
20.0000 mg | INTRAVENOUS | Status: AC | PRN
  Administered 2016-05-21: 20 mg via INTRAVENOUS
  Administered 2016-05-21: 40 mg via INTRAVENOUS
  Filled 2016-05-19: qty 4
  Filled 2016-05-19: qty 8

## 2016-05-19 MED ORDER — MAGNESIUM SULFATE BOLUS VIA INFUSION
6.0000 g | Freq: Once | INTRAVENOUS | Status: DC
  Filled 2016-05-19: qty 500

## 2016-05-19 MED ORDER — LACTATED RINGERS IV SOLN
INTRAVENOUS | Status: DC
  Administered 2016-05-19 – 2016-05-20 (×4): via INTRAVENOUS

## 2016-05-19 MED ORDER — LABETALOL HCL 200 MG PO TABS
200.0000 mg | ORAL_TABLET | Freq: Two times a day (BID) | ORAL | Status: DC
  Administered 2016-05-19 – 2016-05-20 (×4): 200 mg via ORAL
  Filled 2016-05-19 (×5): qty 1

## 2016-05-19 NOTE — MAU Note (Signed)
Pt arrived from EMS. Had 5 gm of Magnesium and Versed on [facility]. Pt responding but unable to remember what happened. Gave Magnesium bolus of 1 gm and infusing 2 gm of Mag. LR running at 125.

## 2016-05-19 NOTE — H&P (Signed)
30 year old female status post vaginal delivery on 05/10/2016 brought in by EMS after suffering 2 seizures at home this morning and 1 in the EMS truck en route to the hospital.  She has been given Magnesium load and is now awake but clearly post ictal. She is questioning whether she had a baby. Her husband is at home with the baby   When she was in the hospital, she was noted to be hypertensive and had a platelet count of 188. She was not on Magnesium at the time. She received 2 unit transfusion for chronic anemia.  Per the nurse, she had a headache for 3 days prior.  BP 155/91  BP 200s in the EMS truck  General - alert to person but not to place Can report city we are in but could not tell me the month  Lung CTAB Car RRR Abdomen is soft and non tender No active vaginal bleeding  Extremities - hyperreflexic  Trace edema  IMPRESSION: Post partum eclampsia  Post ictal now  PLAN: Continue magnesium Hydralazine drip Stat CT Scan of head - if pathology will consult immediately [facility] Neuro checks frequently

## 2016-05-19 NOTE — Progress Notes (Signed)
Pt alert and orient x4. Magnesium sulfate infusing at 2grams per hour. Pt conversing with family at the bedside. Toya Smothers, RN

## 2016-05-19 NOTE — Progress Notes (Signed)
Patient is now completely lucid. Family at bedside.  BP 135/85   Pulse 95   Temp 98.2 F (36.8 C)   Resp (!) 28   Ht 5\' 3"  (1.6 m)   Wt 114.8 kg (253 lb)   LMP 08/19/2015   SpO2 98%   BMI 44.82 kg/m  No results found for this or any previous visit (from the past 24 hour(s)).  CT scan is negative CBC and CMET pending  IMPRESSION: POST PARTUM ECLAMPSIA  PLAN: Continue Magnesium Can advance diet Seizure precautions Start po Labetalol Follow up on Labs and recheck in the morning  All questions are answered

## 2016-05-20 LAB — COMPREHENSIVE METABOLIC PANEL
ALBUMIN: 2.9 g/dL — AB (ref 3.5–5.0)
ALT: 17 U/L (ref 14–54)
ANION GAP: 10 (ref 5–15)
AST: 17 U/L (ref 15–41)
Alkaline Phosphatase: 86 U/L (ref 38–126)
BUN: <5 mg/dL — ABNORMAL LOW (ref 6–20)
CHLORIDE: 103 mmol/L (ref 101–111)
CO2: 24 mmol/L (ref 22–32)
Calcium: 7.1 mg/dL — ABNORMAL LOW (ref 8.9–10.3)
Creatinine, Ser: 0.59 mg/dL (ref 0.44–1.00)
GFR calc Af Amer: >60 mL/min (ref >60)
GFR calc non Af Amer: >60 mL/min (ref >60)
GLUCOSE: 89 mg/dL (ref 65–99)
POTASSIUM: 2.7 mmol/L — AB (ref 3.5–5.1)
SODIUM: 137 mmol/L (ref 135–145)
Total Bilirubin: 1.2 mg/dL (ref 0.3–1.2)
Total Protein: 6.3 g/dL — ABNORMAL LOW (ref 6.5–8.1)

## 2016-05-20 LAB — CBC
HCT: 29.6 % — ABNORMAL LOW (ref 36.0–46.0)
HEMOGLOBIN: 9 g/dL — AB (ref 12.0–15.0)
MCH: 20.3 pg — ABNORMAL LOW (ref 26.0–34.0)
MCHC: 30.4 g/dL (ref 30.0–36.0)
MCV: 66.8 fL — ABNORMAL LOW (ref 78.0–100.0)
Platelets: 264 10*3/uL (ref 150–400)
RBC: 4.43 MIL/uL (ref 3.87–5.11)
RDW: 27.8 % — AB (ref 11.5–15.5)
WBC: 8 10*3/uL (ref 4.0–10.5)

## 2016-05-20 MED ORDER — POTASSIUM CHLORIDE CRYS ER 20 MEQ PO TBCR
40.0000 meq | EXTENDED_RELEASE_TABLET | Freq: Two times a day (BID) | ORAL | Status: AC
  Administered 2016-05-20 (×2): 40 meq via ORAL
  Filled 2016-05-20 (×2): qty 2

## 2016-05-20 NOTE — Progress Notes (Signed)
UR chart review completed.  

## 2016-05-20 NOTE — Progress Notes (Addendum)
No current c/o.  No HA, CP/SOB, RUQ pain, or visual disturbance.  Feeling muscular soreness s/p seizure.  Pt bit her cheek and tongue yesterday and is unable to tolerate solids.    VSS with BPs 140-150s/90s Pre-eclampsia labs wnl K+ 2.7 Hgb 9.0 (hx of chronic anemia)  Gen: A&O x 3, NAD Abd: soft, NT Ext: trace edema, 2+ DTRs, no clonus  HD 2/PPD 10 with eclampsia -Continue magnesium recovery -D/C telemetry -Order soft diet -Continue labetalol 200 bid and dose prn -K+ repletion  Linda Hedges, DO

## 2016-05-20 NOTE — Lactation Note (Signed)
Lactation Consultation Note Mom is re-admit. Has been BF her baby that was born on 11/04 w/o difficulty. Mom has very LARGE pendulum breast w/everted nipple at bottom end of breast. Mom hasn't latched baby since re-admission. Mom had 2 seizures, wasn't able to BF or pump until late this evening. Mom is pumping approx. 2 oz. W/DEBP.  When entered rm. Mom stated she was glad to see [place] that her Lt. Breast was getting very uncomfortable and needs to pump. DEBP at bedside. #27 flanges changed to #24 flanges that fit mom better. Mom having difficulty holding pump d/t all of the IV lines and O2 sat monitors. ASSISTED IN PUMPING. Mom pumped 49ml BF from Lt. Breast. Rt breast only pumped 65ml. I feel that mom wasn't positioned correctly w/pump.  Discussed supply and demand. Mom wasn't sure if she could put baby to breast. Encouraged her to do so and also may need to  Pump the opposite breast to get back up milk stored.  Encouraged to call for any questions or needs.  Baby in rm. W/mom under the care of FOB. Patient Name: April Lucas Today's Date: 05/20/2016     Maternal Data    Feeding    LATCH Score/Interventions       Type of Nipple: Everted at rest and after stimulation              Lactation Tools Discussed/Used Initiated by::  Allayne Stack RN IBCLC) Date initiated:: 05/20/16   Consult Status      Norbert Malkin G 05/20/2016, 2:59 AM

## 2016-05-21 LAB — BASIC METABOLIC PANEL
Anion gap: 8 (ref 5–15)
BUN: 5 mg/dL — AB (ref 6–20)
CHLORIDE: 105 mmol/L (ref 101–111)
CO2: 24 mmol/L (ref 22–32)
CREATININE: 0.55 mg/dL (ref 0.44–1.00)
Calcium: 8.2 mg/dL — ABNORMAL LOW (ref 8.9–10.3)
GFR calc Af Amer: >60 mL/min (ref >60)
GFR calc non Af Amer: >60 mL/min (ref >60)
Glucose, Bld: 79 mg/dL (ref 65–99)
POTASSIUM: 3.7 mmol/L (ref 3.5–5.1)
SODIUM: 137 mmol/L (ref 135–145)

## 2016-05-21 LAB — CBC
HEMATOCRIT: 31.2 % — AB (ref 36.0–46.0)
Hemoglobin: 9.5 g/dL — ABNORMAL LOW (ref 12.0–15.0)
MCH: 20.7 pg — AB (ref 26.0–34.0)
MCHC: 30.4 g/dL (ref 30.0–36.0)
MCV: 68 fL — AB (ref 78.0–100.0)
Platelets: 270 10*3/uL (ref 150–400)
RBC: 4.59 MIL/uL (ref 3.87–5.11)
RDW: 28 % — AB (ref 11.5–15.5)
WBC: 8.8 10*3/uL (ref 4.0–10.5)

## 2016-05-21 MED ORDER — LABETALOL HCL 200 MG PO TABS
200.0000 mg | ORAL_TABLET | Freq: Three times a day (TID) | ORAL | Status: DC
  Administered 2016-05-21 (×2): 200 mg via ORAL
  Filled 2016-05-21 (×5): qty 1

## 2016-05-21 MED ORDER — LABETALOL HCL 200 MG PO TABS: 200.0000 mg | ORAL_TABLET | Freq: Three times a day (TID) | ORAL | 1 refills | Status: DC

## 2016-05-21 MED ORDER — LABETALOL HCL 300 MG PO TABS: 300.0000 mg | ORAL_TABLET | Freq: Three times a day (TID) | ORAL | 1 refills | Status: DC

## 2016-05-21 MED ORDER — LABETALOL HCL 100 MG PO TABS
100.0000 mg | ORAL_TABLET | Freq: Once | ORAL | Status: AC
  Administered 2016-05-21: 100 mg via ORAL
  Filled 2016-05-21: qty 1

## 2016-05-21 NOTE — Progress Notes (Signed)
Patient ID: April Lucas, female   DOB: 08-Aug-1985, 30 y.o.   MRN: [id number] Pt reports feeling much better.  Denies any HA,[place], RUQ pain Desires d/c home Tol po and fluids well  VSSAF  BPs 140-150s/80-90s UOP excellent, had great diuresis  Abd nt,nd Ext trace edema, DTRs 1/4   PP eclampsia - now stable off mag Will cont with BP meds and d/c home with fu in 2 days to recheck.  To monitor at home as well. Will check CBC and bmet before d/c and cont feso4 and KCL supplementation DL

## 2016-05-21 NOTE — Discharge Summary (Addendum)
Physician Discharge Summary  Patient ID: April Lucas MRN: [id number] DOB/AGE: [birth year] 30 y.o.  Admit date: 05/19/2016 Discharge date: 05/21/2016  Admission Diagnoses:Postpartum Gestational HTN with eclampsia  Discharge Diagnoses: Same Active Problems:   Eclampsia   Discharged Condition: good  Hospital Course: Pt admitted after 2 eclamptic seizures at home. Normal CT.  Placed on IV magnesium and BP meds.  Had excellent diuresis and after d/c mag remained stable and was d/cd home on anti HTN meds, KCL for low potassium, and iron for anemia.  With close fu in 2 days in office  Consults: None  Significant Diagnostic Studies: labs: KCL 2.7, Hgb 9, CT of head wnl  Treatments: Magnesium, diuresis, and KCL, FEso4, and labtetolol  Discharge Exam: Blood pressure (!) 156/99, pulse 89, temperature 98.4 F (36.9 C), resp. rate 18, height 5\' 3"  (1.6 m), weight 226 lb 0.1 oz (102.5 kg), last menstrual period 08/19/2015, SpO2 100 %, unknown if currently breastfeeding. General appearance: alert, cooperative, appears stated age and no distress GI: soft, non-tender; bowel sounds normal; no masses,  no organomegaly Extremities: edema DTRs 1/4 and Homans sign is negative, no sign of DVT  Disposition: 01-Home or Self Care  Discharge Instructions    Call MD for:  difficulty breathing, headache or visual disturbances    Complete by:  As directed    Call MD for:  persistant nausea and vomiting    Complete by:  As directed    Call MD for:  redness, tenderness, or signs of infection (pain, swelling, redness, odor or green/yellow discharge around incision site)    Complete by:  As directed    Call MD for:  severe uncontrolled pain    Complete by:  As directed    Call MD for:  temperature >100.4    Complete by:  As directed    Discharge instructions    Complete by:  As directed    Call or return for headaches, increased edema, uncontrolled BP FU in 2 days in office   Increase activity  slowly    Complete by:  As directed        Medication List    TAKE these medications   acetaminophen 500 MG tablet Commonly known as:  TYLENOL Take 1,000 mg by mouth every 6 (six) hours as needed for mild pain, moderate pain or headache.   aspirin-acetaminophen-caffeine 250-250-65 MG tablet Commonly known as:  EXCEDRIN MIGRAINE Take 2 tablets by mouth every 6 (six) hours as needed for headache.   ferrous sulfate 325 (65 FE) MG tablet Take 1 tablet (325 mg total) by mouth 3 (three) times daily with meals.   ibuprofen 600 MG tablet Commonly known as:  ADVIL,MOTRIN Take 600 mg by mouth every 6 (six) hours as needed for mild pain or moderate pain.   labetalol 200 MG tablet Commonly known as:  NORMODYNE Take 1 tablet (200 mg total) by mouth 3 (three) times daily.   oxyCODONE-acetaminophen 5-325 MG tablet Commonly known as:  PERCOCET/ROXICET Take 1 tablet by mouth every 4 (four) hours as needed (for pain scale 4-7).   potassium chloride SA 20 MEQ tablet Commonly known as:  K-DUR,KLOR-CON Take 1 tablet (20 mEq total) by mouth 2 (two) times daily.   PRENATAL GUMMIES/DHA & FA 0.4-32.5 MG Chew Chew 1 each by mouth daily.        Signed: Jaydon Avina C 05/21/2016, 8:37 AM

## 2016-05-21 NOTE — Progress Notes (Signed)
Written discharge instructions given to patient. Patient verbalizes an understanding. No concerns noted at this time. Toya Smothers, RN

## 2016-05-21 NOTE — Progress Notes (Signed)
Contact with Dr. Corinna Capra. Instructed to give patient an additional 100mg  labetalol po now. Will continue to monitor. Toya Smothers, RN

## 2016-05-21 NOTE — Progress Notes (Signed)
DC Summary sent to insurance co per request. No dc needs noted.

## 2016-05-21 NOTE — Progress Notes (Signed)
Dr. Corinna Capra aware of current blood pressures. Home medication for Labetalol changed by Dr. Corinna Capra. New prescription given to patient. Pt educated. Toya Smothers, RN

## 2016-05-21 NOTE — Progress Notes (Signed)
Contact with Dr. Corinna Capra. Dr. Corinna Capra made aware of BP of 169/100. Instructed to retake in 30 minutes. Dr. Corinna Capra also made of aware of morning lab work. Toya Smothers, RN

## 2016-06-25 ENCOUNTER — Institutional Professional Consult (permissible substitution): Payer: Managed Care, Other (non HMO) | Admitting: Medical

## 2016-07-03 ENCOUNTER — Telehealth: Payer: Self-pay

## 2016-07-03 ENCOUNTER — Institutional Professional Consult (permissible substitution): Payer: Managed Care, Other (non HMO) | Admitting: Medical

## 2016-07-03 NOTE — Telephone Encounter (Signed)

## 2016-07-03 NOTE — Telephone Encounter (Signed)
D, and I think this is second no show.  Send letter and fee

## 2016-07-11 NOTE — Telephone Encounter (Signed)
Once again we do not charge no show fee unless it is a cpe. This appt was made by ob/gyn office. Not show letter not sent.

## 2017-08-04 IMAGING — US US OB COMP LESS 14 WK
1 series · 15 of 28 positions shown · non-contrast
Comparison: None.

CLINICAL DATA: Abdominal pain

EXAM:
OBSTETRIC <14 WK US AND TRANSVAGINAL OB US
TECHNIQUE: Both transabdominal and transvaginal ultrasound examinations were
performed for complete evaluation of the gestation as well as the
maternal uterus, adnexal regions, and pelvic cul-de-sac.
Transvaginal technique was performed to assess early pregnancy.

[Series 1: us ob comp less 14 wk · 127 acquisitions, 15 frames shown]
[im 1/127]
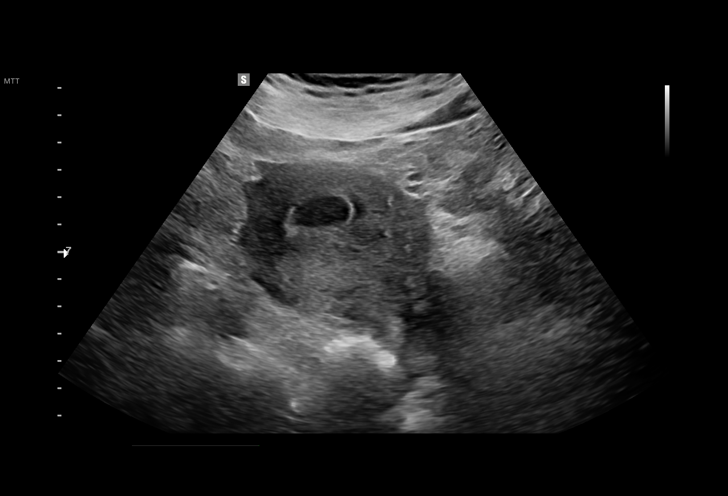
[im 10/127]
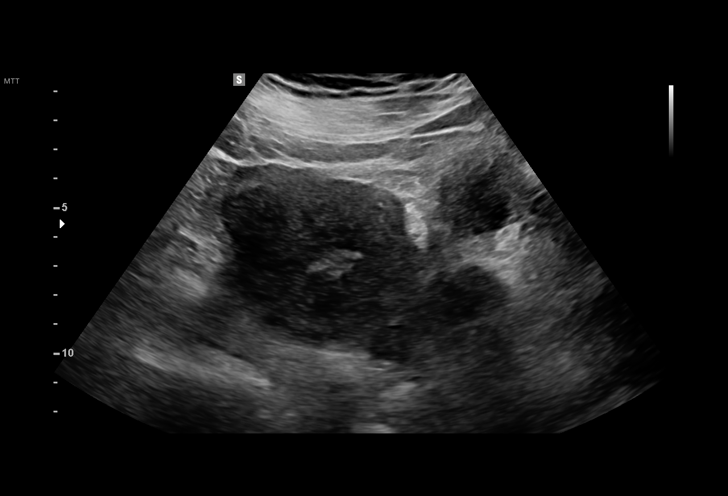
[im 19/127]
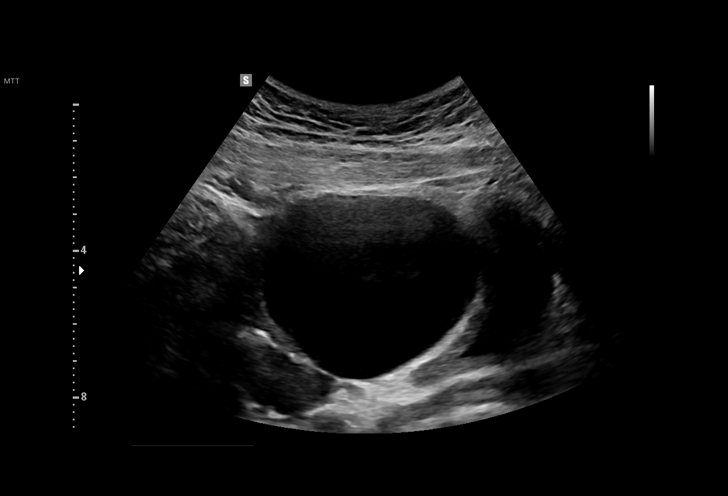
[im 29/127]
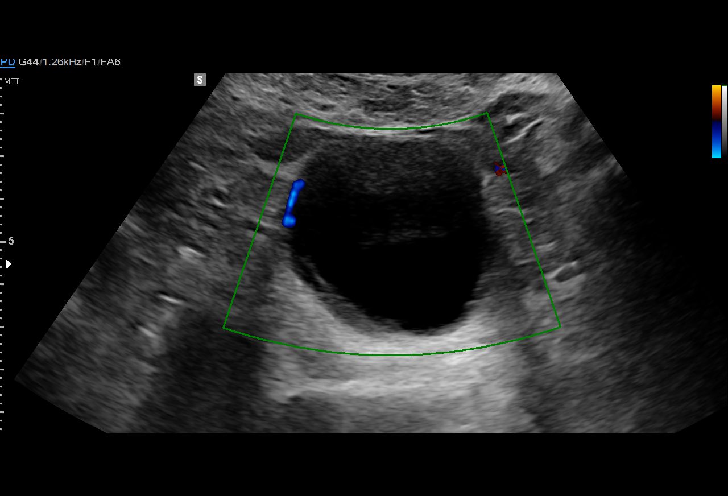
[im 38/127]
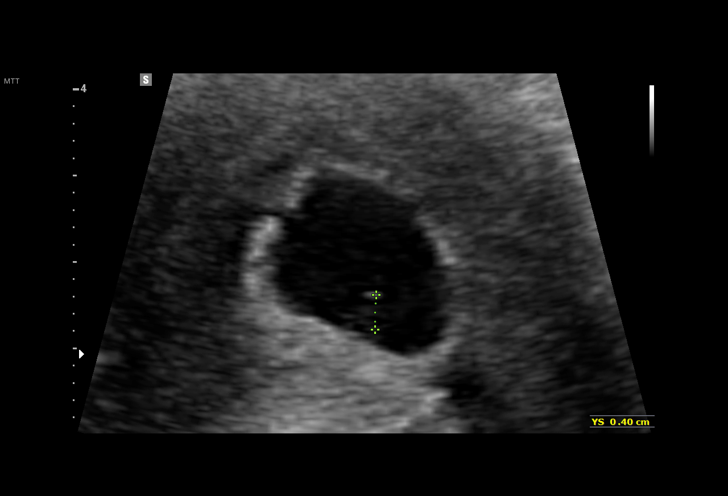
[im 47/127]
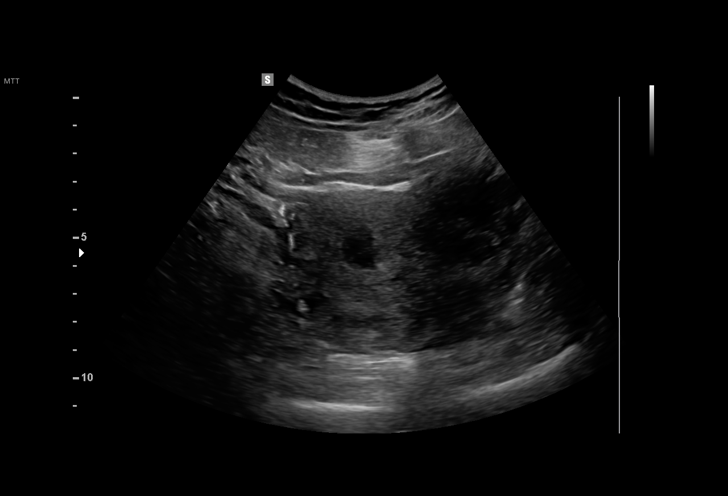
[im 57/127]
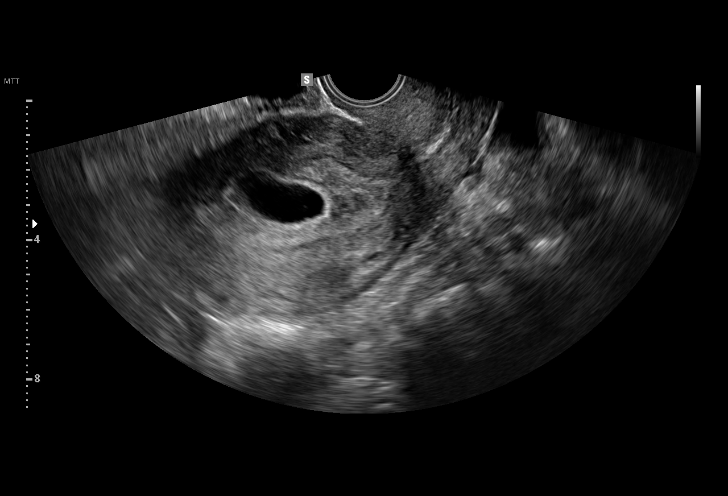
[im 66/127]
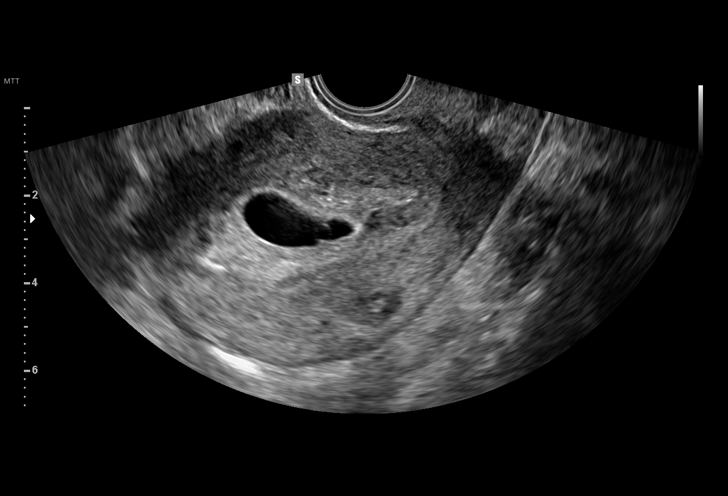
[im 71/127]
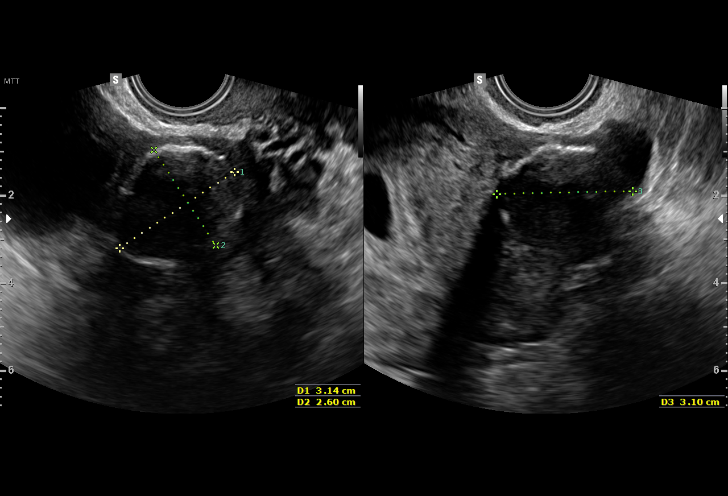
[im 80/127]
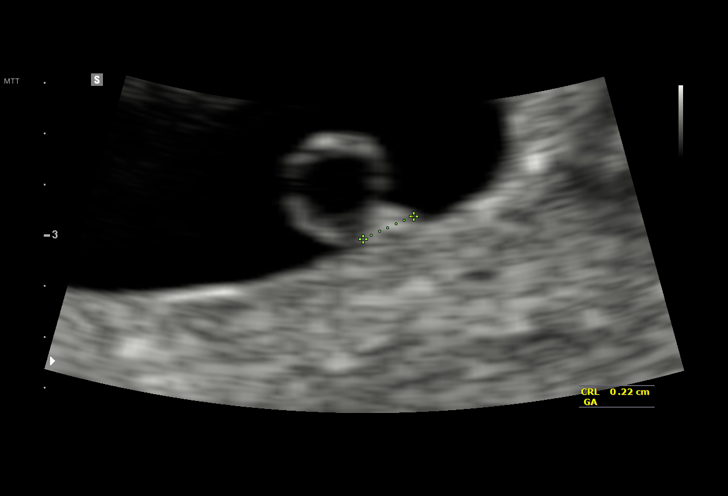
[im 89/127]
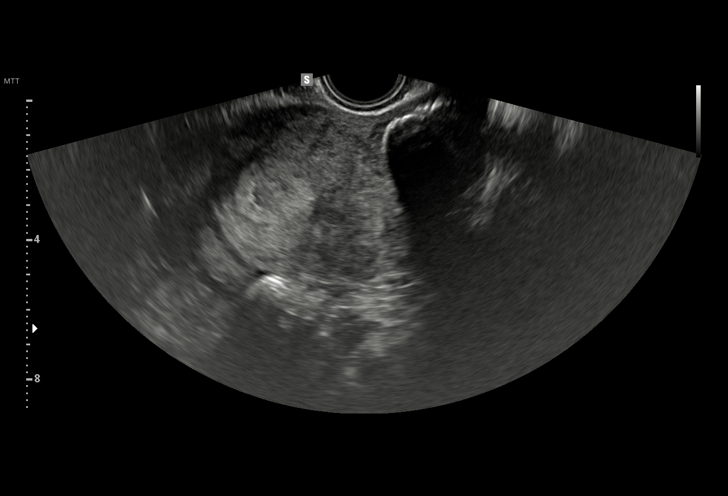
[im 99/127]
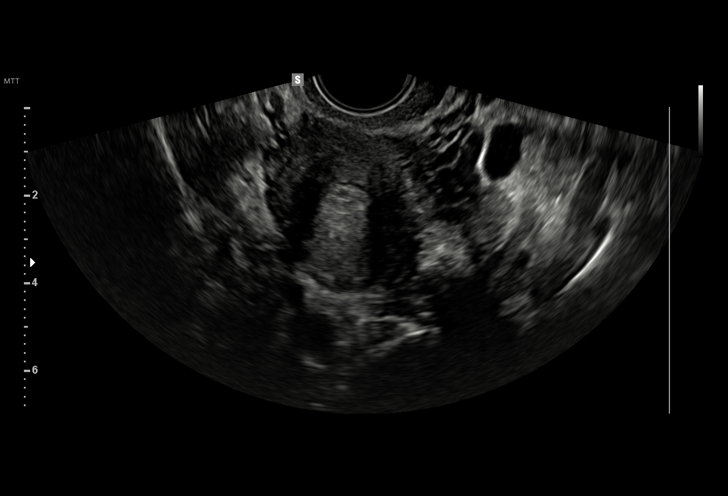
[im 108/127]
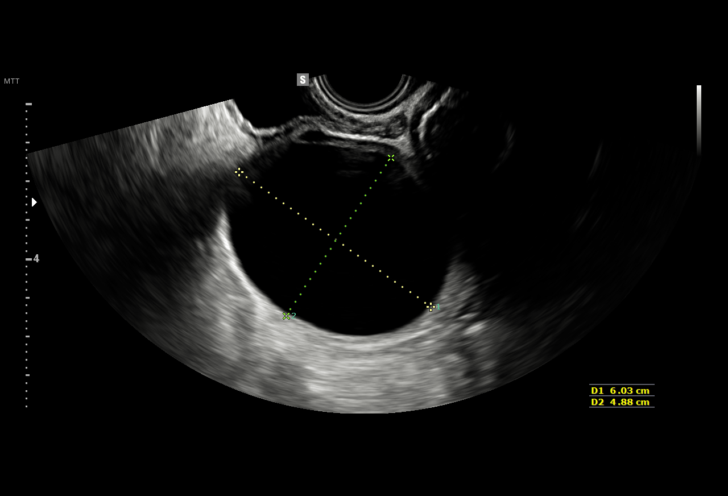
[im 117/127]
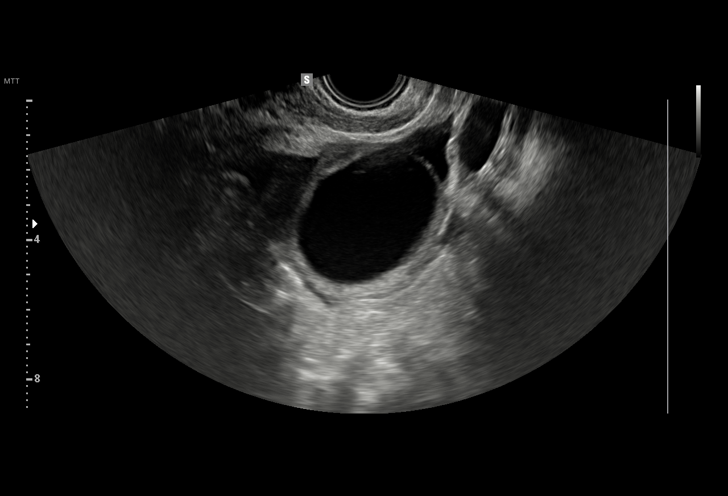
[im 127/127]
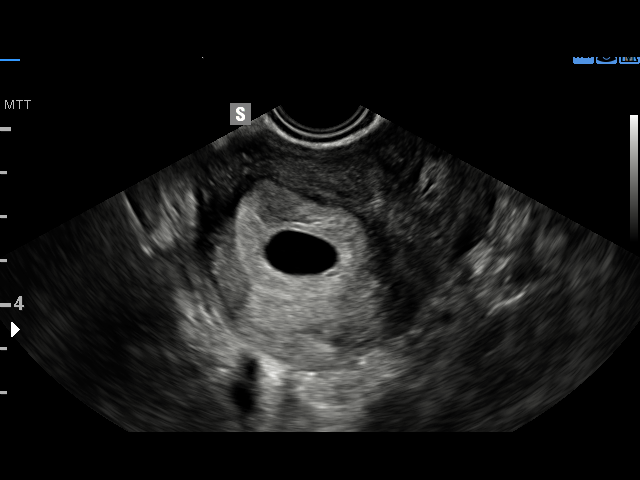

[15 of 28 positions shown; findings below may reference images not displayed]

FINDINGS: Intrauterine gestational sac: Visualized/normal in shape.

Yolk sac:  Present

Embryo:  Present

Cardiac Activity: Present

Heart Rate: 104  bpm

CRL:  2.4  mm   5 w   5 d                  US EDC: 05/28/2016

Subchorionic hemorrhage:  Small subchorionic hemorrhage.

Maternal uterus/adnexae: Normal right ovary. 6 x 4.9 x 5 cm anechoic
left ovarian mass most consistent with a cyst. Hypoechoic left
uterine mass measuring 3.1 x 2.6 x 3.1 cm most consistent with a
fibroid. Hypoechoic mid posterior uterine mass measuring 1.3 x 0.8 x
1.2 cm most consistent with a fibroid. Trace pelvic free fluid.
IMPRESSION: 1. Single live intrauterine pregnancy as detailed above.
2. Small subchorionic hemorrhage.
3. Left ovarian cyst.

## 2018-01-20 LAB — OB RESULTS CONSOLE HIV ANTIBODY (ROUTINE TESTING): HIV: NONREACTIVE

## 2018-01-20 LAB — OB RESULTS CONSOLE RUBELLA ANTIBODY, IGM: Rubella: IMMUNE

## 2018-01-20 LAB — OB RESULTS CONSOLE RPR: RPR: NONREACTIVE

## 2018-01-20 LAB — OB RESULTS CONSOLE HEPATITIS B SURFACE ANTIGEN: Hepatitis B Surface Ag: NEGATIVE

## 2018-06-28 ENCOUNTER — Other Ambulatory Visit (HOSPITAL_COMMUNITY): Payer: Self-pay | Admitting: *Deleted

## 2018-06-29 ENCOUNTER — Ambulatory Visit (HOSPITAL_COMMUNITY)
Admission: RE | Admit: 2018-06-29 | Discharge: 2018-06-29 | Disposition: A | Discharge status: Home / Self Care | Payer: 59 | Source: Ambulatory Visit | Attending: Obstetrics and Gynecology | Admitting: Obstetrics and Gynecology

## 2018-06-29 DIAGNOSIS — Z3A33 33 weeks gestation of pregnancy: Secondary | ICD-10-CM | POA: Diagnosis not present

## 2018-06-29 DIAGNOSIS — O9902 Anemia complicating childbirth: Secondary | ICD-10-CM | POA: Insufficient documentation

## 2018-06-29 MED ORDER — SODIUM CHLORIDE 0.9 % IV SOLN
510.0000 mg | INTRAVENOUS | Status: DC
  Administered 2018-06-29: 510 mg via INTRAVENOUS
  Filled 2018-06-29: qty 510

## 2018-06-29 NOTE — Discharge Instructions (Signed)

## 2018-07-06 ENCOUNTER — Ambulatory Visit (HOSPITAL_COMMUNITY)
Admission: RE | Admit: 2018-07-06 | Discharge: 2018-07-06 | Disposition: A | Discharge status: Home / Self Care | Payer: 59 | Source: Ambulatory Visit | Attending: Obstetrics and Gynecology | Admitting: Obstetrics and Gynecology

## 2018-07-06 DIAGNOSIS — D509 Iron deficiency anemia, unspecified: Secondary | ICD-10-CM | POA: Diagnosis present

## 2018-07-06 MED ORDER — SODIUM CHLORIDE 0.9 % IV SOLN
510.0000 mg | INTRAVENOUS | Status: DC
  Administered 2018-07-06: 510 mg via INTRAVENOUS
  Filled 2018-07-06: qty 17

## 2018-07-07 NOTE — L&D Delivery Note (Signed)
Delivery Note At 5:07 PM a viable female was delivered via Vaginal, Spontaneous   Presentation LOA Apgars pending Weight pending Placenta 3VC Cord PH not sent  Complications Tight nuchal, delivered through it  Anesthesia:  CLE Episiotomy: None Lacerations:  Midline labia minora and small 1st degree Suture Repair: 3.0 vicryl Est. Blood Loss (mL):  300cc   It's a boy "April Lucas" to join his THREE sisters! Mom to postpartum.  Baby to Couplet care / Skin to Skin.  [name] 07/30/2018, 5:25 PM

## 2018-07-30 ENCOUNTER — Other Ambulatory Visit: Payer: Self-pay

## 2018-07-30 ENCOUNTER — Inpatient Hospital Stay (HOSPITAL_COMMUNITY): Payer: 59 | Admitting: Anesthesiology

## 2018-07-30 ENCOUNTER — Inpatient Hospital Stay (HOSPITAL_COMMUNITY)
Admission: AD | Admit: 2018-07-30 | Discharge: 2018-08-01 | DRG: 807 | Disposition: A | Discharge status: Home / Self Care | Payer: 59 | Attending: Obstetrics and Gynecology | Admitting: Obstetrics and Gynecology

## 2018-07-30 ENCOUNTER — Encounter (HOSPITAL_COMMUNITY): Payer: Self-pay | Admitting: *Deleted

## 2018-07-30 DIAGNOSIS — D649 Anemia, unspecified: Secondary | ICD-10-CM | POA: Diagnosis present

## 2018-07-30 DIAGNOSIS — O24420 Gestational diabetes mellitus in childbirth, diet controlled: Secondary | ICD-10-CM | POA: Diagnosis present

## 2018-07-30 DIAGNOSIS — Z3A36 36 weeks gestation of pregnancy: Secondary | ICD-10-CM

## 2018-07-30 DIAGNOSIS — O9962 Diseases of the digestive system complicating childbirth: Secondary | ICD-10-CM | POA: Diagnosis present

## 2018-07-30 DIAGNOSIS — O1414 Severe pre-eclampsia complicating childbirth: Principal | ICD-10-CM | POA: Diagnosis present

## 2018-07-30 DIAGNOSIS — O9902 Anemia complicating childbirth: Secondary | ICD-10-CM | POA: Diagnosis present

## 2018-07-30 DIAGNOSIS — K529 Noninfective gastroenteritis and colitis, unspecified: Secondary | ICD-10-CM | POA: Diagnosis present

## 2018-07-30 DIAGNOSIS — O1493 Unspecified pre-eclampsia, third trimester: Secondary | ICD-10-CM | POA: Diagnosis not present

## 2018-07-30 DIAGNOSIS — E876 Hypokalemia: Secondary | ICD-10-CM | POA: Diagnosis present

## 2018-07-30 DIAGNOSIS — O1490 Unspecified pre-eclampsia, unspecified trimester: Secondary | ICD-10-CM | POA: Diagnosis present

## 2018-07-30 DIAGNOSIS — Z87891 Personal history of nicotine dependence: Secondary | ICD-10-CM | POA: Diagnosis not present

## 2018-07-30 LAB — COMPREHENSIVE METABOLIC PANEL
ALK PHOS: 136 U/L — AB (ref 38–126)
ALT: 39 U/L (ref 0–44)
ALT: 40 U/L (ref 0–44)
AST: 79 U/L — AB (ref 15–41)
AST: 76 U/L — ABNORMAL HIGH (ref 15–41)
Albumin: 2.8 g/dL — ABNORMAL LOW (ref 3.5–5.0)
Albumin: 3 g/dL — ABNORMAL LOW (ref 3.5–5.0)
Alkaline Phosphatase: 142 U/L — ABNORMAL HIGH (ref 38–126)
Anion gap: 11 (ref 5–15)
Anion gap: 11 (ref 5–15)
BILIRUBIN TOTAL: 1.7 mg/dL — AB (ref 0.3–1.2)
BILIRUBIN TOTAL: 1.6 mg/dL — AB (ref 0.3–1.2)
CALCIUM: 7.2 mg/dL — AB (ref 8.9–10.3)
CO2: 23 mmol/L (ref 22–32)
CO2: 20 mmol/L — ABNORMAL LOW (ref 22–32)
CREATININE: 0.44 mg/dL (ref 0.44–1.00)
Calcium: 7.2 mg/dL — ABNORMAL LOW (ref 8.9–10.3)
Chloride: 106 mmol/L (ref 98–111)
Chloride: 106 mmol/L (ref 98–111)
Creatinine, Ser: 0.54 mg/dL (ref 0.44–1.00)
GFR calc Af Amer: >60 mL/min (ref >60)
GFR calc non Af Amer: >60 mL/min (ref >60)
Glucose, Bld: 201 mg/dL — ABNORMAL HIGH (ref 70–99)
Glucose, Bld: 144 mg/dL — ABNORMAL HIGH (ref 70–99)
Potassium: <2 mmol/L — CL (ref 3.5–5.1)
Potassium: 2 mmol/L — CL (ref 3.5–5.1)
Sodium: 140 mmol/L (ref 135–145)
Sodium: 137 mmol/L (ref 135–145)
TOTAL PROTEIN: 6 g/dL — AB (ref 6.5–8.1)
Total Protein: 6.4 g/dL — ABNORMAL LOW (ref 6.5–8.1)

## 2018-07-30 LAB — CBC
HCT: 33.6 % — ABNORMAL LOW (ref 36.0–46.0)
Hemoglobin: 10.4 g/dL — ABNORMAL LOW (ref 12.0–15.0)
MCH: 22.6 pg — ABNORMAL LOW (ref 26.0–34.0)
MCHC: 31 g/dL (ref 30.0–36.0)
MCV: 72.9 fL — ABNORMAL LOW (ref 80.0–100.0)
PLATELETS: 182 10*3/uL (ref 150–400)
RBC: 4.61 MIL/uL (ref 3.87–5.11)
RDW: 24.7 % — ABNORMAL HIGH (ref 11.5–15.5)
WBC: 6.3 10*3/uL (ref 4.0–10.5)
nRBC: 0 % (ref 0.0–0.2)

## 2018-07-30 LAB — URINALYSIS, ROUTINE W REFLEX MICROSCOPIC
Bilirubin Urine: NEGATIVE
GLUCOSE, UA: NEGATIVE mg/dL
Ketones, ur: 80 mg/dL — AB
LEUKOCYTES UA: NEGATIVE
NITRITE: NEGATIVE
PROTEIN: 30 mg/dL — AB
Specific Gravity, Urine: 1.011 (ref 1.005–1.030)
pH: 7 (ref 5.0–8.0)

## 2018-07-30 LAB — TYPE AND SCREEN
ABO/RH(D): O POS
Antibody Screen: NEGATIVE

## 2018-07-30 LAB — CBC WITH DIFFERENTIAL/PLATELET
BASOS PCT: 0 %
Basophils Absolute: 0 10*3/uL (ref 0.0–0.1)
Eosinophils Absolute: 0 10*3/uL (ref 0.0–0.5)
Eosinophils Relative: 0 %
HEMATOCRIT: 32 % — AB (ref 36.0–46.0)
Hemoglobin: 9.8 g/dL — ABNORMAL LOW (ref 12.0–15.0)
Lymphocytes Relative: 11 %
Lymphs Abs: 0.8 10*3/uL (ref 0.7–4.0)
MCH: 22.5 pg — ABNORMAL LOW (ref 26.0–34.0)
MCHC: 30.6 g/dL (ref 30.0–36.0)
MCV: 73.6 fL — AB (ref 80.0–100.0)
MONOS PCT: 3 %
Monocytes Absolute: 0.2 10*3/uL (ref 0.1–1.0)
NEUTROS ABS: 6 10*3/uL (ref 1.7–7.7)
Neutrophils Relative %: 86 %
Platelets: 186 10*3/uL (ref 150–400)
RBC: 4.35 MIL/uL (ref 3.87–5.11)
RDW: 24.7 % — AB (ref 11.5–15.5)
WBC: 7 10*3/uL (ref 4.0–10.5)
nRBC: 0 % (ref 0.0–0.2)

## 2018-07-30 LAB — PROTEIN / CREATININE RATIO, URINE
CREATININE, URINE: 113 mg/dL
Protein Creatinine Ratio: 0.54 mg/mg{Cre} — ABNORMAL HIGH (ref 0.00–0.15)
Total Protein, Urine: 61 mg/dL

## 2018-07-30 LAB — GLUCOSE, CAPILLARY
Glucose-Capillary: 106 mg/dL — ABNORMAL HIGH (ref 70–99)
Glucose-Capillary: 163 mg/dL — ABNORMAL HIGH (ref 70–99)

## 2018-07-30 MED ORDER — ZOLPIDEM TARTRATE 5 MG PO TABS: 5.0000 mg | ORAL_TABLET | Freq: Every evening | ORAL | Status: DC | PRN

## 2018-07-30 MED ORDER — LABETALOL HCL 5 MG/ML IV SOLN: 20.0000 mg | INTRAVENOUS | Status: DC | PRN

## 2018-07-30 MED ORDER — POTASSIUM CHLORIDE 10 MEQ/100ML IV SOLN
10.0000 meq | INTRAVENOUS | Status: AC
  Administered 2018-07-30 (×2): 10 meq via INTRAVENOUS
  Filled 2018-07-30 (×2): qty 100

## 2018-07-30 MED ORDER — SIMETHICONE 80 MG PO CHEW: 80.0000 mg | CHEWABLE_TABLET | ORAL | Status: DC | PRN

## 2018-07-30 MED ORDER — LACTATED RINGERS IV SOLN
INTRAVENOUS | Status: DC
  Administered 2018-07-30: 13:00:00 via INTRAVENOUS

## 2018-07-30 MED ORDER — LABETALOL HCL 5 MG/ML IV SOLN: 40.0000 mg | INTRAVENOUS | Status: DC | PRN

## 2018-07-30 MED ORDER — LIDOCAINE HCL (PF) 1 % IJ SOLN
30.0000 mL | INTRAMUSCULAR | Status: DC | PRN
  Administered 2018-07-30: 30 mL via SUBCUTANEOUS
  Filled 2018-07-30: qty 30

## 2018-07-30 MED ORDER — OXYCODONE-ACETAMINOPHEN 5-325 MG PO TABS: 2.0000 | ORAL_TABLET | ORAL | Status: DC | PRN

## 2018-07-30 MED ORDER — DIPHENHYDRAMINE HCL 50 MG/ML IJ SOLN: 12.5000 mg | INTRAMUSCULAR | Status: DC | PRN

## 2018-07-30 MED ORDER — OXYTOCIN 40 UNITS IN NORMAL SALINE INFUSION - SIMPLE MED
1.0000 m[IU]/min | INTRAVENOUS | Status: DC
  Administered 2018-07-30: 2 m[IU]/min via INTRAVENOUS
  Filled 2018-07-30: qty 1000

## 2018-07-30 MED ORDER — PHENYLEPHRINE 40 MCG/ML (10ML) SYRINGE FOR IV PUSH (FOR BLOOD PRESSURE SUPPORT)
80.0000 ug | PREFILLED_SYRINGE | INTRAVENOUS | Status: DC | PRN
  Filled 2018-07-30 (×2): qty 10

## 2018-07-30 MED ORDER — DEXTROSE IN LACTATED RINGERS 5 % IV SOLN: INTRAVENOUS | Status: DC

## 2018-07-30 MED ORDER — DIPHENHYDRAMINE HCL 25 MG PO CAPS: 25.0000 mg | ORAL_CAPSULE | Freq: Four times a day (QID) | ORAL | Status: DC | PRN

## 2018-07-30 MED ORDER — LABETALOL HCL 5 MG/ML IV SOLN
20.0000 mg | INTRAVENOUS | Status: DC | PRN
  Administered 2018-07-30: 20 mg via INTRAVENOUS
  Filled 2018-07-30: qty 4

## 2018-07-30 MED ORDER — POTASSIUM CHLORIDE 10 MEQ/100ML IV SOLN
10.0000 meq | INTRAVENOUS | Status: AC
  Administered 2018-07-30 (×4): 10 meq via INTRAVENOUS
  Filled 2018-07-30 (×4): qty 100

## 2018-07-30 MED ORDER — ONDANSETRON HCL 4 MG PO TABS: 4.0000 mg | ORAL_TABLET | ORAL | Status: DC | PRN

## 2018-07-30 MED ORDER — WITCH HAZEL-GLYCERIN EX PADS: 1.0000 "application " | MEDICATED_PAD | CUTANEOUS | Status: DC | PRN

## 2018-07-30 MED ORDER — DEXTROSE IN LACTATED RINGERS 5 % IV SOLN
Freq: Once | INTRAVENOUS | Status: AC
  Administered 2018-07-30: 10:00:00 via INTRAVENOUS

## 2018-07-30 MED ORDER — ONDANSETRON HCL 4 MG/2ML IJ SOLN: 4.0000 mg | Freq: Four times a day (QID) | INTRAMUSCULAR | Status: DC | PRN

## 2018-07-30 MED ORDER — TERBUTALINE SULFATE 1 MG/ML IJ SOLN: 0.2500 mg | Freq: Once | INTRAMUSCULAR | Status: DC | PRN

## 2018-07-30 MED ORDER — HYDRALAZINE HCL 20 MG/ML IJ SOLN: 10.0000 mg | INTRAMUSCULAR | Status: DC | PRN

## 2018-07-30 MED ORDER — SOD CITRATE-CITRIC ACID 500-334 MG/5ML PO SOLN: 30.0000 mL | ORAL | Status: DC | PRN

## 2018-07-30 MED ORDER — MAGNESIUM SULFATE BOLUS VIA INFUSION
4.0000 g | Freq: Once | INTRAVENOUS | Status: AC
  Administered 2018-07-30: 4 g via INTRAVENOUS
  Filled 2018-07-30: qty 500

## 2018-07-30 MED ORDER — EPHEDRINE 5 MG/ML INJ
10.0000 mg | INTRAVENOUS | Status: DC | PRN
  Filled 2018-07-30: qty 2

## 2018-07-30 MED ORDER — MAGNESIUM SULFATE 40 G IN LACTATED RINGERS - SIMPLE
2.0000 g/h | INTRAVENOUS | Status: AC
  Administered 2018-07-30 – 2018-07-31 (×2): 2 g/h via INTRAVENOUS
  Filled 2018-07-30 (×2): qty 500

## 2018-07-30 MED ORDER — ACETAMINOPHEN 325 MG PO TABS
650.0000 mg | ORAL_TABLET | ORAL | Status: DC | PRN
  Administered 2018-08-01: 650 mg via ORAL
  Filled 2018-07-30: qty 2

## 2018-07-30 MED ORDER — IBUPROFEN 600 MG PO TABS
600.0000 mg | ORAL_TABLET | Freq: Four times a day (QID) | ORAL | Status: DC
  Administered 2018-07-30 – 2018-08-01 (×8): 600 mg via ORAL
  Filled 2018-07-30 (×8): qty 1

## 2018-07-30 MED ORDER — SODIUM CHLORIDE 0.45 % IV SOLN
INTRAVENOUS | Status: DC
  Administered 2018-07-30: 21:00:00 via INTRAVENOUS

## 2018-07-30 MED ORDER — OXYCODONE HCL 5 MG PO TABS
5.0000 mg | ORAL_TABLET | ORAL | Status: DC | PRN
  Administered 2018-07-31 – 2018-08-01 (×2): 5 mg via ORAL
  Filled 2018-07-30 (×2): qty 1

## 2018-07-30 MED ORDER — FENTANYL 2.5 MCG/ML BUPIVACAINE 1/10 % EPIDURAL INFUSION (WH - ANES)
14.0000 mL/h | INTRAMUSCULAR | Status: DC | PRN
  Administered 2018-07-30: 14 mL/h via EPIDURAL
  Filled 2018-07-30: qty 100

## 2018-07-30 MED ORDER — LACTATED RINGERS IV SOLN: 500.0000 mL | INTRAVENOUS | Status: DC | PRN

## 2018-07-30 MED ORDER — FERROUS SULFATE 325 (65 FE) MG PO TABS
325.0000 mg | ORAL_TABLET | Freq: Three times a day (TID) | ORAL | Status: DC
  Administered 2018-07-31 – 2018-08-01 (×5): 325 mg via ORAL
  Filled 2018-07-30 (×5): qty 1

## 2018-07-30 MED ORDER — LACTATED RINGERS IV SOLN: INTRAVENOUS | Status: DC

## 2018-07-30 MED ORDER — COCONUT OIL OIL
1.0000 "application " | TOPICAL_OIL | Status: DC | PRN
  Administered 2018-07-31: 1 via TOPICAL
  Filled 2018-07-30: qty 120

## 2018-07-30 MED ORDER — TETANUS-DIPHTH-ACELL PERTUSSIS 5-2.5-18.5 LF-MCG/0.5 IM SUSP: 0.5000 mL | Freq: Once | INTRAMUSCULAR | Status: DC

## 2018-07-30 MED ORDER — OXYTOCIN BOLUS FROM INFUSION
500.0000 mL | Freq: Once | INTRAVENOUS | Status: AC
  Administered 2018-07-30: 500 mL via INTRAVENOUS

## 2018-07-30 MED ORDER — DIBUCAINE 1 % RE OINT: 1.0000 "application " | TOPICAL_OINTMENT | RECTAL | Status: DC | PRN

## 2018-07-30 MED ORDER — PHENYLEPHRINE 40 MCG/ML (10ML) SYRINGE FOR IV PUSH (FOR BLOOD PRESSURE SUPPORT)
80.0000 ug | PREFILLED_SYRINGE | INTRAVENOUS | Status: DC | PRN
  Filled 2018-07-30: qty 10

## 2018-07-30 MED ORDER — LABETALOL HCL 5 MG/ML IV SOLN: 80.0000 mg | INTRAVENOUS | Status: DC | PRN

## 2018-07-30 MED ORDER — ONDANSETRON HCL 4 MG/2ML IJ SOLN: 4.0000 mg | INTRAMUSCULAR | Status: DC | PRN

## 2018-07-30 MED ORDER — LACTATED RINGERS IV SOLN
500.0000 mL | Freq: Once | INTRAVENOUS | Status: AC
  Administered 2018-07-30: 500 mL via INTRAVENOUS

## 2018-07-30 MED ORDER — PRENATAL MULTIVITAMIN CH
1.0000 | ORAL_TABLET | Freq: Every day | ORAL | Status: DC
  Administered 2018-07-31 – 2018-08-01 (×2): 1 via ORAL
  Filled 2018-07-30 (×2): qty 1

## 2018-07-30 MED ORDER — OXYCODONE-ACETAMINOPHEN 5-325 MG PO TABS: 1.0000 | ORAL_TABLET | ORAL | Status: DC | PRN

## 2018-07-30 MED ORDER — ACETAMINOPHEN 325 MG PO TABS: 650.0000 mg | ORAL_TABLET | ORAL | Status: DC | PRN

## 2018-07-30 MED ORDER — BENZOCAINE-MENTHOL 20-0.5 % EX AERO
1.0000 "application " | INHALATION_SPRAY | CUTANEOUS | Status: DC | PRN
  Administered 2018-07-31: 1 via TOPICAL
  Filled 2018-07-30: qty 56

## 2018-07-30 MED ORDER — LACTATED RINGERS IV SOLN
Freq: Once | INTRAVENOUS | Status: AC
  Administered 2018-07-30: 11:00:00 via INTRAVENOUS
  Filled 2018-07-30: qty 10

## 2018-07-30 MED ORDER — BETAMETHASONE SOD PHOS & ACET 6 (3-3) MG/ML IJ SUSP
12.0000 mg | INTRAMUSCULAR | Status: DC
  Administered 2018-07-30: 12 mg via INTRAMUSCULAR
  Filled 2018-07-30: qty 2

## 2018-07-30 MED ORDER — LACTATED RINGERS IV SOLN
INTRAVENOUS | Status: DC
  Administered 2018-07-30: 14:00:00 via INTRAVENOUS

## 2018-07-30 MED ORDER — LIDOCAINE HCL (PF) 1 % IJ SOLN
INTRAMUSCULAR | Status: DC | PRN
  Administered 2018-07-30 (×2): 4 mL via EPIDURAL

## 2018-07-30 MED ORDER — POTASSIUM CHLORIDE 10 MEQ/100ML IV SOLN
10.0000 meq | Freq: Once | INTRAVENOUS | Status: AC
  Administered 2018-07-30: 10 meq via INTRAVENOUS
  Filled 2018-07-30: qty 100

## 2018-07-30 MED ORDER — ONDANSETRON HCL 4 MG/2ML IJ SOLN
4.0000 mg | Freq: Once | INTRAMUSCULAR | Status: AC
  Administered 2018-07-30: 4 mg via INTRAVENOUS
  Filled 2018-07-30: qty 2

## 2018-07-30 MED ORDER — OXYCODONE HCL 5 MG PO TABS
10.0000 mg | ORAL_TABLET | ORAL | Status: DC | PRN
  Administered 2018-07-31: 10 mg via ORAL
  Filled 2018-07-30: qty 2

## 2018-07-30 MED ORDER — OXYTOCIN 40 UNITS IN NORMAL SALINE INFUSION - SIMPLE MED: 2.5000 [IU]/h | INTRAVENOUS | Status: DC

## 2018-07-30 MED ORDER — PRENATAL GUMMIES/DHA & FA 0.4-32.5 MG PO CHEW: 1.0000 | CHEWABLE_TABLET | Freq: Every day | ORAL | Status: DC

## 2018-07-30 NOTE — MAU Note (Signed)
Pt presents to MAU with complaints of contractions since last night with N/V/D

## 2018-07-30 NOTE — Anesthesia Pain Management Evaluation Note (Signed)
  CRNA Pain Management Visit Note  Patient: April Lucas, 33 y.o., female  "Hello I am a member of the anesthesia team at [facility]. We have an anesthesia team available at all times to provide care throughout the hospital, including epidural management and anesthesia for C-section. I don't know your plan for the delivery whether it a natural birth, water birth, IV sedation, nitrous supplementation, doula or epidural, but we want to meet your pain goals."   1.Was your pain managed to your expectations on prior hospitalizations?   No prior hospitalizations  2.What is your expectation for pain management during this hospitalization?     Epidural  3.How can we help you reach that goal? Epidural intact  Record the patient's initial score and the patient's pain goal.   Pain: 0  Pain Goal: 5 [facility] wants you to be able to say your pain was always managed very well.  Everette Rank 07/30/2018

## 2018-07-30 NOTE — Progress Notes (Signed)
Cardiac monitoring initiated at 15:30 per ANMD.  RN at bedside.

## 2018-07-30 NOTE — Progress Notes (Signed)
Spoke with Pharmacy who recommended at least four more bags of 10 of potassium as potassium corrects approx 0.1 with each bag and k just at 2. Will Continue repletion in this manner. Repeat K levels in six hours. Depending on that value, will determine need for additional potassium.

## 2018-07-30 NOTE — Anesthesia Preprocedure Evaluation (Signed)
Anesthesia Evaluation  Patient identified by MRN, date of birth, ID band Patient awake    Reviewed: Allergy & Precautions, Patient's Chart, lab work & pertinent test results  History of Anesthesia Complications Negative for: history of anesthetic complications  Airway Mallampati: III  TM Distance: >3 FB Neck ROM: Full    Dental  (+) Teeth Intact   Pulmonary former smoker,    Pulmonary exam normal breath sounds clear to auscultation       Cardiovascular hypertension (gestational), Pt. on medications and Pt. on home beta blockers Normal cardiovascular exam Rhythm:Regular Rate:Normal     Neuro/Psych Seizures - (Hx of eclampsia postpartum in 2007, no further seizures),  Anxiety    GI/Hepatic negative GI ROS, Neg liver ROS,   Endo/Other  diabetes, Gestational  Renal/GU negative Renal ROS     Musculoskeletal negative musculoskeletal ROS (+)   Abdominal   Peds  Hematology  (+) anemia ,   Anesthesia Other Findings Day of surgery medications reviewed with the patient.  Reproductive/Obstetrics (+) Pregnancy                             Anesthesia Physical Anesthesia Plan  ASA: III  Anesthesia Plan: Epidural   Post-op Pain Management:    Induction:   PONV Risk Score and Plan: Treatment may vary due to age or medical condition  Airway Management Planned: Natural Airway  Additional Equipment:   Intra-op Plan:   Post-operative Plan:   Informed Consent: I have reviewed the patients History and Physical, chart, labs and discussed the procedure including the risks, benefits and alternatives for the proposed anesthesia with the patient or authorized representative who has indicated his/her understanding and acceptance.       Plan Discussed with: CRNA  Anesthesia Plan Comments:         Anesthesia Quick Evaluation

## 2018-07-30 NOTE — Progress Notes (Signed)
RN notified Dr. Royston Sinner via phone call that patient feeling a lot of pressure with contractions and cervix at 5cm/90/-2.  MD states she is on the way to the bedside as soon as possible.

## 2018-07-30 NOTE — H&P (Signed)
Gracelynn Anthis is a 33 y.o. female presenting with pre-eclampsia with severe features.  Presented with N/V/D. 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 None since arrived here. Unable to tolerate solids since last night. Has been able to tolerate ginger ale and water. Runs a daycare and multiple children with same symptoms. Husband and daughter also have same illness.  She arrived in MAU and found to have persistent severe range Bps requiring IV Labetalol.  She denies RUQ pain, visual disturbances, headache,SOB or new onset swelling. UPC elevated to 0.54 and elevated AST to 79. Incidentally, K found to be undetectable at <2. Of note, she has a h/o chronic hypokalemia of unclear etiology.   OB History    Gravida  5   Para  3   Term  2   Preterm  1   AB  1   Living  3     SAB  1   TAB      Ectopic      Multiple  0   Live Births  3          G1 NSVD 46 wga G2 NSVD @ 74 wga c/b spontaneous PTB + PIH G3 SAB G4 NSVD @ 19 wega c/b requiring pRBC after delivery (hgb 6 prior to delivery) as well as needed IOL for PIH --> pp eclampsia. She also had low potassium requiring IV K.  G5 SAB G6 Current, has been on weekly 17OHP w/scattered compliance  Past Medical History:  Diagnosis Date  . Anemia   . Anxiety   . Diabetes mellitus without complication (HCC)    GDM diet controlled; 2nd pg only  . Fibroid   . Hx of varicella   . Hypertension    Gestational hypertension only  . Miscarriage 04/2015  . Obesity    Past Surgical History:  Procedure Laterality Date  . extraction of wisdom teeth     Family History: family history includes Diabetes in her father and another family member; Hypertension in her father and another family member. Social History:  reports that she has quit smoking. She has never used smokeless tobacco. She reports that she does not drink alcohol or use drugs.     Maternal Diabetes: Yes - unclear type. Elevated 3hr GTT and patient never  followed up with nutrition Genetic Screening: Normal Maternal Ultrasounds/Referrals: Normal Fetal Ultrasounds or other Referrals:  None Maternal Substance Abuse:  No Significant Maternal Medications:  None Significant Maternal Lab Results:  None Other Comments:  None   CBC    Component Value Date/Time   WBC 7.0 07/30/2018 1048   RBC 4.35 07/30/2018 1048   HGB 9.8 (L) 07/30/2018 1048   HCT 32.0 (L) 07/30/2018 1048   PLT 186 07/30/2018 1048   MCV 73.6 (L) 07/30/2018 1048   MCH 22.5 (L) 07/30/2018 1048   MCHC 30.6 07/30/2018 1048   RDW 24.7 (H) 07/30/2018 1048   LYMPHSABS 0.8 07/30/2018 1048   MONOABS 0.2 07/30/2018 1048   EOSABS 0.0 07/30/2018 1048   BASOSABS 0.0 07/30/2018 1048   CMP     Component Value Date/Time   NA 140 07/30/2018 1048   K <2.0 (LL) 07/30/2018 1048   CL 106 07/30/2018 1048   CO2 23 07/30/2018 1048   GLUCOSE 201 (H) 07/30/2018 1048   BUN <5 (L) 07/30/2018 1048   CREATININE 0.44 07/30/2018 1048   CREATININE 0.58 05/08/2015 0001   CALCIUM 7.2 (L) 07/30/2018 1048   PROT 6.0 (L) 07/30/2018 1048  ALBUMIN 2.8 (L) 07/30/2018 1048   AST 79 (H) 07/30/2018 1048   ALT 39 07/30/2018 1048   ALKPHOS 136 (H) 07/30/2018 1048   BILITOT 1.7 (H) 07/30/2018 1048   GFRNONAA >60 07/30/2018 1048   GFRAA >60 07/30/2018 1048   ROS History Dilation: 2 Effacement (%): 50 Station: -3 Exam by:: ginger morris rn Blood pressure (!) 148/87, pulse 93, temperature 99.5 F (37.5 C), temperature source Oral, resp. rate 16, height 5\' 3"  (1.6 m), weight 105.2 kg, last menstrual period 11/18/2017, SpO2 99 %, unknown if currently breastfeeding. Exam Physical Exam  NAD, A&O NWOB Abd soft, nondistended, gravid  Prenatal labs: ABO, Rh: --/--/O POS (01/24 1048) Antibody: PENDING (01/24 1048) Rubella: Immune (07/17 0000) RPR: Nonreactive (07/17 0000)  HBsAg: Negative (07/17 0000)  HIV: Non-reactive (07/17 0000)  GBS:   NEGATIVE  Assessment/Plan: 33yo [id number] @ 91. 2 wga  presenting w/severe PIH, pregnancy c/b multiple factors most notably C/b occasional non-compliance (several missed appt and never followed up on GDM).   # PIH w/severe features: by BP and doubling of AST. UPC 0.54. Prior pregnancies c/b PIH and has been on baby ASA this pregnancy. Hx pp eclampsia last pregnancy in 2017 and at risk again. Monitor closely.  - no s/s on exam or by pt complaints - labs q 6 hours for now - Initiate IV Mag - IV antihypertensives prn  # IOL  - pitocin per protocol - s/p AROM for clear fluid - now 4cm  # N/V/D: multiple sick contacts with same s/s, likely viral Gastroenteritis - IVF - Enteric precautions  # HypoK: CHRONIC, likely worsened by GI illness - replete K+ - w/u for kidney dz pp as outpatient? Seems to be chronic issue and persist even though supplementation.   # GDM: BS 200 even after not eating anything in 12 hours. Gingerale an hour before BS taken. Has not followed up despite repeated reccs to do so and repeated scheduling attempts. For now will treat as diet controlled given no readings prior to this. Repeat BS now to see if >120, if >120, will need insulin per protocol.   # Chronic anemia: - required rbc transfusion w/last delivery - s/p IV iron x 2  - hgb 9.8 today - Cont iron pp and low threshold for transfusion  # Grandmult[place]:  - at risk for PPH - low threshold for uterotonics  # FWB: cat 1 tracing, reassuring  - BMZ for FLM - cefm  # GBS: neg    [name] 07/30/2018, 12:59 PM

## 2018-07-30 NOTE — MAU Provider Note (Signed)
History     CSN: [id number]  Arrival date and time: 07/30/18 8921   First Provider Initiated Contact with Patient 07/30/18 1001      Chief Complaint  Patient presents with  . Contractions  . Emesis  . Diarrhea   HPI April Lucas is a 33 y.o. [phone] at [redacted]w[redacted]d who presents to MAU for evaluation of contractions as well as new onset nausea, vomiting and diarrhea. Both problems began last night. 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 She is unable to estimate how many times she has been sick since yesterday afternoon btu verbalizes "it feels nonstop".   She endorses pain across the top of her abdomen, denies lower abdominal pain or back pain. She also denies vaginal bleeding, leaking of fluid, decreased fetal movement, fever, falls, or recent illness.    Patient's blood pressure is elevated on arrival to MAU. She states this is her first elevated BP this pregnancy but she has high blood pressure at 36 weeks with all her previous pregnancies. She denies RUQ pain, visual disturbances, headache,SOB or new onset swelling.  OB History    Gravida  5   Para  3   Term  2   Preterm  1   AB  1   Living  3     SAB  1   TAB      Ectopic      Multiple  0   Live Births  3           Past Medical History:  Diagnosis Date  . Anemia   . Anxiety   . Diabetes mellitus without complication (HCC)    GDM diet controlled; 2nd pg only  . Fibroid   . Hx of varicella   . Hypertension    Gestational hypertension only  . Miscarriage 04/2015  . Obesity     Past Surgical History:  Procedure Laterality Date  . extraction of wisdom teeth      Family History  Problem Relation Age of Onset  . Hypertension Other   . Diabetes Other   . Hypertension Father   . Diabetes Father   . Other Neg Hx   . Hearing loss Neg Hx     Social History   Tobacco Use  . Smoking status: Former Research scientist (life sciences)  . Smokeless tobacco:  Never Used  . Tobacco comment: as teenager  Substance Use Topics  . Alcohol use: No    Alcohol/week: 3.0 standard drinks    Types: 3 Glasses of wine per week    Comment: not with preg  . Drug use: No    Allergies: No Known Allergies  Medications Prior to Admission  Medication Sig Dispense Refill Last Dose  . acetaminophen (TYLENOL) 500 MG tablet Take 1,000 mg by mouth every 6 (six) hours as needed for mild pain, moderate pain or headache.   Past Week at Unknown time  . aspirin-acetaminophen-caffeine (EXCEDRIN MIGRAINE) 250-250-65 MG tablet Take 2 tablets by mouth every 6 (six) hours as needed for headache.   05/19/2016 at 0400  . ferrous sulfate 325 (65 FE) MG tablet Take 1 tablet (325 mg total) by mouth 3 (three) times daily with meals. 90 tablet 3 Past Week at Unknown time  . ibuprofen (ADVIL,MOTRIN) 600 MG tablet Take 600 mg by mouth every 6 (six) hours as needed for mild pain or moderate pain.   05/19/2016 at 0000  . labetalol (NORMODYNE) 300  MG tablet Take 1 tablet (300 mg total) by mouth 3 (three) times daily. 90 tablet 1   . oxyCODONE-acetaminophen (PERCOCET/ROXICET) 5-325 MG tablet Take 1 tablet by mouth every 4 (four) hours as needed (for pain scale 4-7).   05/19/2016 at 0000  . potassium chloride SA (K-DUR,KLOR-CON) 20 MEQ tablet Take 1 tablet (20 mEq total) by mouth 2 (two) times daily. 14 tablet 0 Past Week at Unknown time  . Prenatal MV-Min-FA-Omega-3 (PRENATAL GUMMIES/DHA & FA) 0.4-32.5 MG CHEW Chew 1 each by mouth daily.   Past Month at Unknown time    Review of Systems  Constitutional: Negative for chills, fatigue and fever.  Gastrointestinal: Positive for abdominal pain.  Genitourinary: Negative for pelvic pain, vaginal bleeding, vaginal discharge and vaginal pain.  Musculoskeletal: Negative for back pain.  Neurological: Negative for dizziness, seizures, syncope, weakness and headaches.  All other systems reviewed and are negative.  Physical Exam   Blood pressure  (!) 154/84, pulse 95, temperature 98.9 F (37.2 C), resp. rate 16, height 5\' 3"  (1.6 m), weight 105.2 kg, last menstrual period 11/18/2017, unknown if currently breastfeeding.  Physical Exam  Nursing note and vitals reviewed. Constitutional: She is oriented to person, place, and time. She appears well-developed and well-nourished.  Cardiovascular: Normal rate.  Respiratory: Effort normal and breath sounds normal. No respiratory distress. She has no wheezes. She has no rales.  GI:  Gravid  Genitourinary:    Uterus normal.     Vaginal discharge present.   Neurological: She is alert and oriented to person, place, and time. She has normal strength. No cranial nerve deficit or sensory deficit.  Skin: Skin is warm and dry.  Psychiatric: She has a normal mood and affect. Her behavior is normal. Judgment and thought content normal.    MAU Course/MDM   --Plan of care including elevated BPs, PEC lab results, cervical change and severe range pressure discussed with Dr. Royston Sinner at 1115 --Severe range x 2 resolved with IV Labetalol --Category I tracing: baseline 145, moderate variability, positive accelerations, no decels --Toco: irregular q 2-5, palpate mild. Patient endorses reduced pain after IV hydration   Patient Vitals for the past 24 hrs:  BP Temp Pulse Resp SpO2 Height Weight  07/30/18 1201 (!) 148/87 - - - - - -  07/30/18 1200 - - - - 99 % - -  07/30/18 1155 - - - - 100 % - -  07/30/18 1150 - - - - 99 % - -  07/30/18 1146 (!) 147/86 - - - - - -  07/30/18 1131 (!) 170/92 - 93 - - - -  07/30/18 1116 (!) 161/94 - - - - - -  07/30/18 1101 (!) 146/65 - 93 - - - -  07/30/18 1046 (!) 141/70 - - - - - -  07/30/18 1031 138/78 - 88 - - - -  07/30/18 1016 136/73 - 92 - - - -  07/30/18 1005 (!) 150/81 - - - - - -  07/30/18 0956 (!) 154/84 98.9 F (37.2 C) 95 16 - 5\' 3"  (1.6 m) 105.2 kg  07/30/18 0955 (!) 154/84 - - - - - -    Results for orders placed or performed during the hospital  encounter of 07/30/18 (from the past 24 hour(s))  Urinalysis, Routine w reflex microscopic     Status: Abnormal   Collection Time: 07/30/18 10:03 AM  Result Value Ref Range   Color, Urine YELLOW YELLOW   APPearance HAZY (A) CLEAR   Specific  Gravity, Urine 1.011 1.005 - 1.030   pH 7.0 5.0 - 8.0   Glucose, UA NEGATIVE NEGATIVE mg/dL   Hgb urine dipstick SMALL (A) NEGATIVE   Bilirubin Urine NEGATIVE NEGATIVE   Ketones, ur 80 (A) NEGATIVE mg/dL   Protein, ur 30 (A) NEGATIVE mg/dL   Nitrite NEGATIVE NEGATIVE   Leukocytes, UA NEGATIVE NEGATIVE   RBC / HPF 0-5 0 - 5 RBC/hpf   WBC, UA 0-5 0 - 5 WBC/hpf   Bacteria, UA RARE (A) NONE SEEN   Squamous Epithelial / LPF 6-10 0 - 5   Mucus PRESENT   Protein / creatinine ratio, urine     Status: Abnormal   Collection Time: 07/30/18 10:03 AM  Result Value Ref Range   Creatinine, Urine 113.00 mg/dL   Total Protein, Urine 61 mg/dL   Protein Creatinine Ratio 0.54 (H) 0.00 - 0.15 mg/mg[Cre]  CBC with Differential/Platelet     Status: Abnormal   Collection Time: 07/30/18 10:48 AM  Result Value Ref Range   WBC 7.0 4.0 - 10.5 K/uL   RBC 4.35 3.87 - 5.11 MIL/uL   Hemoglobin 9.8 (L) 12.0 - 15.0 g/dL   HCT 32.0 (L) 36.0 - 46.0 %   MCV 73.6 (L) 80.0 - 100.0 fL   MCH 22.5 (L) 26.0 - 34.0 pg   MCHC 30.6 30.0 - 36.0 g/dL   RDW 24.7 (H) 11.5 - 15.5 %   Platelets 186 150 - 400 K/uL   nRBC 0.0 0.0 - 0.2 %   Neutrophils Relative % 86 %   Neutro Abs 6.0 1.7 - 7.7 K/uL   Lymphocytes Relative 11 %   Lymphs Abs 0.8 0.7 - 4.0 K/uL   Monocytes Relative 3 %   Monocytes Absolute 0.2 0.1 - 1.0 K/uL   Eosinophils Relative 0 %   Eosinophils Absolute 0.0 0.0 - 0.5 K/uL   Basophils Relative 0 %   Basophils Absolute 0.0 0.0 - 0.1 K/uL  Comprehensive metabolic panel     Status: Abnormal (Preliminary result)   Collection Time: 07/30/18 10:48 AM  Result Value Ref Range   Sodium 140 135 - 145 mmol/L   Potassium PENDING 3.5 - 5.1 mmol/L   Chloride 106 98 - 111 mmol/L    CO2 23 22 - 32 mmol/L   Glucose, Bld 201 (H) 70 - 99 mg/dL   BUN PENDING 6 - 20 mg/dL   Creatinine, Ser 0.44 0.44 - 1.00 mg/dL   Calcium 7.2 (L) 8.9 - 10.3 mg/dL   Total Protein 6.0 (L) 6.5 - 8.1 g/dL   Albumin 2.8 (L) 3.5 - 5.0 g/dL   AST 79 (H) 15 - 41 U/L   ALT 39 0 - 44 U/L   Alkaline Phosphatase 136 (H) 38 - 126 U/L   Total Bilirubin 1.7 (H) 0.3 - 1.2 mg/dL   GFR calc non Af Amer >60 >60 mL/min   GFR calc Af Amer >60 >60 mL/min   Anion gap 11 5 - 15    Assessment and Plan  --33 y.o. [id number] at [redacted]w[redacted]d  --Preeclampsia --Reactive fetal tracing --Per Dr. Royston Sinner, admit to [facility] [place], CNM 07/30/2018, 12:04 PM

## 2018-07-30 NOTE — Anesthesia Procedure Notes (Signed)
Epidural Patient location during procedure: OB Start time: 07/30/2018 1:49 PM End time: 07/30/2018 1:52 PM  Staffing Anesthesiologist: Brennan Bailey, MD Performed: anesthesiologist   Preanesthetic Checklist Completed: patient identified, pre-op evaluation, timeout performed, IV checked, risks and benefits discussed and monitors and equipment checked  Epidural Patient position: sitting Prep: site prepped and draped and DuraPrep Patient monitoring: continuous pulse ox, blood pressure, heart rate and cardiac monitor Approach: midline Location: L3-L4 Injection technique: LOR air  Needle:  Needle type: Tuohy  Needle gauge: 17 G Needle length: 9 cm Needle insertion depth: 9 cm Catheter type: closed end flexible Catheter size: 19 Gauge Catheter at skin depth: 14 cm Test dose: negative and Other (1% lidocaine)  Assessment Events: blood not aspirated, injection not painful, no injection resistance, negative IV test and no paresthesia  Additional Notes Patient identified. Risks, benefits, and alternatives discussed with patient including but not limited to bleeding, infection, nerve damage, paralysis, failed block, incomplete pain control, headache, blood pressure changes, nausea, vomiting, reactions to medication, itching, and postpartum back pain. Confirmed with bedside nurse the patient's most recent platelet count. Confirmed with patient that they are not currently taking any anticoagulation, have any bleeding history, or any family history of bleeding disorders. Patient expressed understanding and wished to proceed. All questions were answered. Sterile technique was used throughout the entire procedure. Crisp LOR after one needle redirection. Please see nursing notes for vital signs. Test dose was given through epidural catheter and negative prior to continuing to dose epidural or start infusion. Warning signs of high block given to the patient including shortness of breath,  tingling/numbness in hands, complete motor block, or any concerning symptoms with instructions to call for help. Patient was given instructions on fall risk and not to get out of bed. All questions and concerns addressed with instructions to call with any issues or inadequate analgesia.  Reason for block:procedure for pain

## 2018-07-31 DIAGNOSIS — E876 Hypokalemia: Secondary | ICD-10-CM

## 2018-07-31 LAB — COMPREHENSIVE METABOLIC PANEL
ALT: 37 U/L (ref 0–44)
AST: 60 U/L — ABNORMAL HIGH (ref 15–41)
Albumin: 2.5 g/dL — ABNORMAL LOW (ref 3.5–5.0)
Alkaline Phosphatase: 124 U/L (ref 38–126)
Anion gap: 9 (ref 5–15)
BILIRUBIN TOTAL: 1.1 mg/dL (ref 0.3–1.2)
BUN: <5 mg/dL — ABNORMAL LOW (ref 6–20)
CHLORIDE: 108 mmol/L (ref 98–111)
CO2: 22 mmol/L (ref 22–32)
Calcium: 7.2 mg/dL — ABNORMAL LOW (ref 8.9–10.3)
Creatinine, Ser: 0.42 mg/dL — ABNORMAL LOW (ref 0.44–1.00)
GFR calc Af Amer: >60 mL/min (ref >60)
GFR calc non Af Amer: >60 mL/min (ref >60)
Glucose, Bld: 136 mg/dL — ABNORMAL HIGH (ref 70–99)
Sodium: 139 mmol/L (ref 135–145)
Total Protein: 5.9 g/dL — ABNORMAL LOW (ref 6.5–8.1)

## 2018-07-31 LAB — BASIC METABOLIC PANEL
Anion gap: 12 (ref 5–15)
CO2: 19 mmol/L — ABNORMAL LOW (ref 22–32)
Calcium: 7.3 mg/dL — ABNORMAL LOW (ref 8.9–10.3)
Chloride: 107 mmol/L (ref 98–111)
Creatinine, Ser: 0.59 mg/dL (ref 0.44–1.00)
GFR calc Af Amer: >60 mL/min (ref >60)
Glucose, Bld: 184 mg/dL — ABNORMAL HIGH (ref 70–99)
Potassium: <2 mmol/L — CL (ref 3.5–5.1)
Sodium: 138 mmol/L (ref 135–145)

## 2018-07-31 LAB — URINALYSIS, ROUTINE W REFLEX MICROSCOPIC
Bilirubin Urine: NEGATIVE
Glucose, UA: NEGATIVE mg/dL
KETONES UR: NEGATIVE mg/dL
Leukocytes, UA: NEGATIVE
Nitrite: NEGATIVE
PH: 7 (ref 5.0–8.0)
Protein, ur: NEGATIVE mg/dL
Specific Gravity, Urine: 1.006 (ref 1.005–1.030)

## 2018-07-31 LAB — MAGNESIUM
Magnesium: 4.9 mg/dL — ABNORMAL HIGH (ref 1.7–2.4)
Magnesium: 5.1 mg/dL — ABNORMAL HIGH (ref 1.7–2.4)

## 2018-07-31 LAB — CBC
HCT: 30.7 % — ABNORMAL LOW (ref 36.0–46.0)
Hemoglobin: 9.5 g/dL — ABNORMAL LOW (ref 12.0–15.0)
MCH: 22.6 pg — ABNORMAL LOW (ref 26.0–34.0)
MCHC: 30.9 g/dL (ref 30.0–36.0)
MCV: 73.1 fL — ABNORMAL LOW (ref 80.0–100.0)
NRBC: 0 % (ref 0.0–0.2)
Platelets: 190 10*3/uL (ref 150–400)
RBC: 4.2 MIL/uL (ref 3.87–5.11)
RDW: 24.8 % — AB (ref 11.5–15.5)
WBC: 9.7 10*3/uL (ref 4.0–10.5)

## 2018-07-31 LAB — POTASSIUM
Potassium: 2.1 mmol/L — CL (ref 3.5–5.1)
Potassium: 2.2 mmol/L — CL (ref 3.5–5.1)

## 2018-07-31 LAB — NA AND K (SODIUM & POTASSIUM), RAND UR
Potassium Urine: 5 mmol/L
Sodium, Ur: 59 mmol/L

## 2018-07-31 LAB — CREATININE, URINE, RANDOM: Creatinine, Urine: 25 mg/dL

## 2018-07-31 LAB — RPR: RPR Ser Ql: NONREACTIVE

## 2018-07-31 MED ORDER — POTASSIUM CHLORIDE CRYS ER 20 MEQ PO TBCR
20.0000 meq | EXTENDED_RELEASE_TABLET | Freq: Once | ORAL | Status: AC
  Administered 2018-07-31: 20 meq via ORAL
  Filled 2018-07-31: qty 1

## 2018-07-31 MED ORDER — POTASSIUM CHLORIDE 2 MEQ/ML IV SOLN
INTRAVENOUS | Status: AC
  Administered 2018-07-31: 09:00:00 via INTRAVENOUS
  Filled 2018-07-31: qty 1000

## 2018-07-31 MED ORDER — POTASSIUM CHLORIDE CRYS ER 20 MEQ PO TBCR
40.0000 meq | EXTENDED_RELEASE_TABLET | Freq: Once | ORAL | Status: AC
  Administered 2018-07-31: 40 meq via ORAL
  Filled 2018-07-31: qty 2

## 2018-07-31 MED ORDER — POTASSIUM CHLORIDE CRYS ER 20 MEQ PO TBCR
40.0000 meq | EXTENDED_RELEASE_TABLET | ORAL | Status: AC
  Administered 2018-07-31 – 2018-08-01 (×4): 40 meq via ORAL
  Filled 2018-07-31 (×4): qty 2

## 2018-07-31 MED ORDER — POTASSIUM CHLORIDE CRYS ER 20 MEQ PO TBCR: 40.0000 meq | EXTENDED_RELEASE_TABLET | ORAL | Status: DC

## 2018-07-31 MED ORDER — SODIUM CHLORIDE 0.45 % IV SOLN
INTRAVENOUS | Status: DC
  Administered 2018-07-31: 02:00:00 via INTRAVENOUS
  Filled 2018-07-31 (×2): qty 1000

## 2018-07-31 MED ORDER — POTASSIUM CHLORIDE CRYS ER 20 MEQ PO TBCR
40.0000 meq | EXTENDED_RELEASE_TABLET | ORAL | Status: AC
  Administered 2018-07-31 (×2): 40 meq via ORAL
  Filled 2018-07-31 (×2): qty 2

## 2018-07-31 NOTE — Lactation Note (Signed)
This note was copied from a baby's chart. Lactation Consultation Note  Patient Name: April Lucas Date: 07/31/2018 Reason for consult: Initial assessment;Late-preterm 34-36.6wks P4, 10 hour female infant, LPTI Mom enteric precautions due GI symptoms. Per parents infant had 3 voids and 2 stools. LC did not observe a latch at this time. Mom is experience in breastfeeding, per mom, she  breastfeed her 2nd daughter for 2 years and recently stopped breastfeeding her 84rd child at 31 months due to being pregnant again ( closed spaced pregnancy).  Per mom, she breastfeed infant at 2:30 am for 15 minutes and then infant was given 10 ml of Similac with iron with slow flow bottle nipple. Mom will breastfeed according hunger cues and not exceed 3 hours without breastfeeding infant. Mom shown how to use DEBP & how to disassemble, clean, & reassemble parts. Mom will use DEBP every 3 hours for 15 minutes. LC discussed I &O. Mom plans to breastfeed infant first, then pump and give infant back EBM and then supplement with formula according infant;s age / hours. Reviewed Baby & Me book's Breastfeeding Basics.  Mom will call Nurse or [place] if she has any further questions, concerns or need assistance with latching infant to breast. Mom made aware of O/P services, breastfeeding support groups, community resources, and our phone # for post-discharge questions.  Maternal Data Formula Feeding for Exclusion: No Has patient been taught Hand Expression?: Yes(Mom demonstrated hand expression and colostrum present.) Does the patient have breastfeeding experience prior to this delivery?: Yes  Feeding Feeding Type: Breast Fed  LATCH Score                   Interventions Interventions: Breast feeding basics reviewed;Position options;Hand express;Expressed milk;DEBP;Hand pump  Lactation Tools Discussed/Used WIC Program: No Pump Review: Setup, frequency, and cleaning;Milk Storage;Other  (comment) Initiated by:: Vicente Serene, [place] Date initiated:: 07/31/18   Consult Status Consult Status: Follow-up Date: 07/31/18 Follow-up type: In-patient    Vicente Serene 07/31/2018, 3:28 AM

## 2018-07-31 NOTE — Progress Notes (Signed)
Post Partum Day 1 Subjective: up ad lib, voiding, tolerating PO and UE muscle weakness  Objective: Blood pressure 130/83, pulse 84, temperature 97.7 F (36.5 C), temperature source Oral, resp. rate 18, height 5\' 3"  (1.6 m), weight 105.2 kg, last menstrual period 11/18/2017, SpO2 98 %, unknown if currently breastfeeding.  Physical Exam:  General: alert, cooperative and appears stated age Lochia: appropriate Uterine Fundus: firm Incision: healing well, no significant drainage, no dehiscence, no significant erythema DVT Evaluation: No evidence of DVT seen on physical exam. Negative Homan's sign. No cords or calf tenderness. No significant calf/ankle edema. Neuro: A&O x 3, normal relfexes through-out, 4/5 strength UE 5/5 rest  Recent Labs    07/30/18 1710 07/31/18 0630  HGB 10.4* 9.5*  HCT 33.6* 30.7*    Assessment/Plan: 33yo [id number] now PPD#1 after IOL s/s PIH w/severe features.    # PP: lochia appropriate. VF. Ambulating.   # Hypokalemia: chronic, worsened by GI illness. No other severe electrolyte abnl. Refractory to repletion. New onset of symptoms this am (weakness).  - Internal med to come see patient. For now they recommend continuing oral repletion and 1L bolus of LR. They are in agreement of likely chronic component.  - ctm closely for rebound hyperK or worsening hypoK - cont telemetry  # PIH w/severe features: by BP and doubling of AST. UPC 0.54 - AST downtrending this AM - Mag level therapeutic - no s/s toxicity. REFLEXES ALL WNL. - no s/s on exam or by pt complaints - Mag until 24 hours pp - IV antihypertensives prn  # Gastroenteritis: s/s seem to have resolved.  - Enteric precautions for now to be continued  # Chronic anemia: - required rbc transfusion w/last delivery - s/p IV iron x 2  - hgb  - Cont iron pp and low threshold for transfusion   LOS: 1 day   April Lucas 07/31/2018, 8:06 AM

## 2018-07-31 NOTE — Final Progress Note (Signed)
CRITICAL VALUE ALERT  Critical Value: potassium less than 2.0 Date & Time Notied:  0130 Provider Notified: Dr Royston Sinner Orders Received/Actions taken: new order given

## 2018-07-31 NOTE — Lactation Note (Signed)
This note was copied from a baby's chart. Lactation Consultation Note  Patient Name: April Lucas [id number] Date: 07/31/2018 Reason for consult: Follow-up assessment;Late-preterm 34-36.6wks  P4 mother whose infant is now 13 hours old.  Mother breast fed her second child for 2 years and her third child for 18 months.  Mother stated she only stopped feeding her third child because she was pregnant and her nipples were too sensitive.  Mother is on enteric precautions.  Baby was sleeping in bassinet when I arrived.  He was circumcised this a.m. and not showing feeding cues.  Mother stated he last fed at 53 and wanted to see if he would awaken to feed.  Mother's breasts are large, soft and non tender and nipples are large, everted and intact.  She was able to express a large drop of colostrum which I finger fed back to baby to help awaken him.  Attempted to latch in the cross cradle position on the left breast.  Baby did not show any feeding cues and would not open his mouth.  Allowed mother to observe him and she agreed that he was not ready to feed.  Placed him STS and he fell asleep.  Encouraged her to continue observing for feeding cues and feed 8-12 times/24 hours or sooner if he shows feeding cues.  She will hand express before/after feedings to help increase milk supply.    Mother will call for latch assistance as needed.  Father present and supportive.  Mother has a DEBP for home use.     Maternal Data Formula Feeding for Exclusion: No Has patient been taught Hand Expression?: Yes Does the patient have breastfeeding experience prior to this delivery?: Yes  Feeding Feeding Type: Breast Fed  LATCH Score Latch: Too sleepy or reluctant, no latch achieved, no sucking elicited.  Audible Swallowing: None  Type of Nipple: Everted at rest and after stimulation  Comfort (Breast/Nipple): Soft / non-tender  Hold (Positioning): Assistance needed to correctly position infant at breast  and maintain latch.  LATCH Score: 5  Interventions Interventions: Breast feeding basics reviewed;Assisted with latch;Skin to skin;Breast massage;Hand express;Position options;Support pillows;Adjust position  Lactation Tools Discussed/Used WIC Program: No   Consult Status Consult Status: Follow-up Date: 08/01/18 Follow-up type: In-patient    Little Ishikawa 07/31/2018, 12:33 PM

## 2018-07-31 NOTE — Progress Notes (Signed)
MOB was referred for history of depression/anxiety. * Referral screened out by Clinical Social Worker because none of the following criteria appear to apply: ~ History of anxiety/depression during this pregnancy, or of post-partum depression following prior delivery. ~ Diagnosis of anxiety and/or depression within last 3 years OR * MOB's symptoms currently being treated with medication and/or therapy. Please contact the Clinical Social Worker if needs arise, by MOB request, or if MOB scores greater than 9/yes to question 10 on Edinburgh Postpartum Depression Screen.  Jeramie Scogin Boyd-Gilyard, MSW, LCSW Clinical Social Work [phone]  

## 2018-07-31 NOTE — Anesthesia Postprocedure Evaluation (Signed)
Anesthesia Post Note  Patient: Media planner  Procedure(s) Performed: AN AD HOC LABOR EPIDURAL     Patient location during evaluation: Mother Baby Anesthesia Type: Epidural Level of consciousness: awake Pain management: satisfactory to patient Vital Signs Assessment: post-procedure vital signs reviewed and stable Respiratory status: spontaneous breathing Cardiovascular status: stable Anesthetic complications: no    Last Vitals:  Vitals:   07/31/18 0500 07/31/18 0754  BP:  130/83  Pulse: 84 84  Resp: 17 18  Temp:  36.5 C  SpO2:  98%    Last Pain:  Vitals:   07/31/18 0754  TempSrc: Oral  PainSc:    Pain Goal: Patients Stated Pain Goal: 3 (07/31/18 9604)                 Casimer Lanius

## 2018-07-31 NOTE — Progress Notes (Addendum)
Hypocalemia is refractory to treatment Has had 37mEq x SIX per pharmacy recs. After 3 doses, K went from <2 to 2.0, going in the right direction. However, after 3 additional doses, K again downtrended. Thus, route changed to 40 meq in IVF. AM labs pending. However, patient now symptomatic for the first time - prior to an hour ago had no s/s hypocalemia. Approx 1 hour ago, pt reports new onset of upper extremity weakness. On exam, mild dec in b/l UE. Normal reflexes throughout.  Diarrhea has completely resolved - no further emesis. Now tolerating PO.  Consult was called urgently to both nephrology and internal medicine and call back is awaited. For now, given pt is now tolerating PO, will swtich to PO Kcl in hopes of improvement. It is concerning that pt is now symptomatic even after so many doses. Concern for underlying kidney abnl. Patient at high risk for rebound HYPERK. Cont telemetry.  Remainder labs wnl. No s/s mag toxic - all reflexes normal, respiratory effort normal. Mag level sent to ensure appropriate.

## 2018-07-31 NOTE — Consult Note (Signed)
[facility] Medical Consultation  April Lucas [id number]:[id number] DOB: [birth year] DOA: 07/30/2018 PCP: [facility]   Requesting physician: Dr. Royston Sinner Date of consultation: 07/31/2018 Reason for consultation: profound hypokalemia  HPI:  This is a pleasant 33 year old pregnant female who was admitted to [facility] on 07/30/2018 with abdominal pain, nausea vomiting and diarrhea for couple of days.  She is at term, was found to have preeclampsia, and delivered on 07/26/2018.  She was found to have persistent hypokalemia with potassium undetectable on several repeat labs despite aggressive repletion and we were asked to consult.  Patient evaluated bedside, currently she complains of slight upper extremity weakness and difficulties raising her arms above her head, but no other complaints.  She states that for 2 to 3 days prior to coming to the hospital she has had intractable nausea vomiting as well as diarrhea, along with several family members.  She works in a daycare and thinks that they got a viral infection.  Her symptoms have resolved, she has not had any diarrhea in the last 24 hours.  She is now able to tolerate a diet.  She reports prior history of profound hypokalemia with a prior delivery in 2017, was on potassium supplementation for a little while after being hospitalized but then never had any hypokalemia issues further on.  She says that she is seen regularly on a yearly basis and has a physical as well as labs, and she was never told that she had low potassium other than when she delivered in 2017 and now.  Denies any personal history of kidney stones or any other kidney problems.  Currently she has mild abdominal pain following delivery.  She denies any fever or chills.  She had no flulike illness with a GI symptoms as above.  Review of Systems:  As per HPI, otherwise 10 point review of systems negative   Impression/Recommendations Active Problems:  Preeclampsia    1. Profound hypokalemia -this is most likely in the setting of recent viral GI illness with profuse nausea vomiting and diarrhea.  I am not sure about her hypokalemia back in 2017 but does not look like she has had low potassiums at baseline.  I don't have access to her outpatient labs, most recent potassium in 2017 was 3.7.  We will continue to keep patient on telemetry, there are no arrhythmias currently, will replete potassium aggressively, recheck K level at 11 AM and adjust supplementation accordingly.  Magnesium was drawn earlier on and it was 4.9, suspect may have been drawn while magnesium is infusing that is why it was elevated.  We will recheck a magnesium at 11 AM as well.  For completeness, we will check a urine potassium to creatinine ratio but suspect that the losses of extrarenal.  She does not seem to meet criteria for RTA, her urine pH was 7.0, she has no history of stones, her bicarbonate is normal and she is not hyperchloremic. 2. Rest of the problem per primary  A hospitalist will follow morning labs again tomorrow and further recommendations will follow   Past Medical History:  Diagnosis Date  . Anemia   . Anxiety   . Diabetes mellitus without complication (HCC)    GDM diet controlled; 2nd pg only  . Fibroid   . Hx of varicella   . Hypertension    Gestational hypertension only  . Miscarriage 04/2015  . Obesity    Past Surgical History:  Procedure Laterality Date  . extraction of wisdom  teeth     Social History:  reports that she has quit smoking. She has never used smokeless tobacco. She reports that she does not drink alcohol or use drugs.  No Known Allergies Family History  Problem Relation Age of Onset  . Hypertension Other   . Diabetes Other   . Hypertension Father   . Diabetes Father   . Other Neg Hx   . Hearing loss Neg Hx     Prior to Admission medications   Medication Sig Start Date End Date Taking? Authorizing Provider  ferrous  sulfate 325 (65 FE) MG tablet Take 1 tablet (325 mg total) by mouth 3 (three) times daily with meals. 05/12/16  Yes Juanda Chance, NP  Prenatal MV-Min-FA-Omega-3 (PRENATAL GUMMIES/DHA & FA) 0.4-32.5 MG CHEW Chew 1 each by mouth daily.   Yes [provider]   Physical Exam: Blood pressure 130/83, pulse 84, temperature 97.7 F (36.5 C), temperature source Oral, resp. rate 18, height 5\' 3"  (1.6 m), weight 105.2 kg, last menstrual period 11/18/2017, SpO2 98 %, unknown if currently breastfeeding. Vitals:   07/31/18 0500 07/31/18 0754  BP:  130/83  Pulse: 84 84  Resp: 17 18  Temp:  97.7 F (36.5 C)  SpO2:  98%     General: Pleasant, no distress  Eyes: No scleral icterus  ENT: Moist mucous membranes  Cardiovascular: Regular rate and rhythm, soft SEM heard over the left sternal border, trace lower extremity edema  Respiratory: Clear to auscultation bilaterally without wheezing or crackles, moves air well  Abdomen: Mild tenderness to palpation but no guarding or rebound  Skin: No rashes seen  Musculoskeletal: Normal muscle mass  Psychiatric: Normal mood and affect  Neurologic: Cranial nerves grossly intact, equal strength in all 4 extremities however there is a degree of generalized weakness.  Labs on Admission:  Basic Metabolic Panel: Recent Labs  Lab 07/30/18 1048 07/30/18 1710 07/31/18 0020 07/31/18 0630  NA 140 137 138 139  K <2.0* 2.0* <2.0* <2.0*  CL 106 106 107 108  CO2 23 20* 19* 22  GLUCOSE 201* 144* 184* 136*  BUN <5* <5* <5* <5*  CREATININE 0.44 0.54 0.59 0.42*  CALCIUM 7.2* 7.2* 7.3* 7.2*  MG  --   --   --  4.9*   Liver Function Tests: Recent Labs  Lab 07/30/18 1048 07/30/18 1710 07/31/18 0630  AST 79* 76* 60*  ALT 39 40 37  ALKPHOS 136* 142* 124  BILITOT 1.7* 1.6* 1.1  PROT 6.0* 6.4* 5.9*  ALBUMIN 2.8* 3.0* 2.5*   No results for input(s): LIPASE, AMYLASE in the last 168 hours. No results for input(s): AMMONIA in the last 168  hours. CBC: Recent Labs  Lab 07/30/18 1048 07/30/18 1710 07/31/18 0630  WBC 7.0 6.3 9.7  NEUTROABS 6.0  --   --   HGB 9.8* 10.4* 9.5*  HCT 32.0* 33.6* 30.7*  MCV 73.6* 72.9* 73.1*  PLT 186 182 190   Cardiac Enzymes: No results for input(s): CKTOTAL, CKMB, CKMBINDEX, TROPONINI in the last 168 hours. BNP: Invalid input(s): POCBNP CBG: Recent Labs  Lab 07/30/18 1453 07/30/18 2333  GLUCAP 106* 163*    Radiological Exams on Admission: No results found.  EKG: Independently reviewed.  Telemetry shows sinus rhythm   Genene Kilman [facility] Pager [phone]  If 7PM-7AM, please contact night-coverage [email or web address] Password [email or web address] 07/31/2018, 8:11 AM

## 2018-08-01 DIAGNOSIS — E876 Hypokalemia: Secondary | ICD-10-CM | POA: Clinically undetermined

## 2018-08-01 LAB — BASIC METABOLIC PANEL
Anion gap: 9 (ref 5–15)
CO2: 22 mmol/L (ref 22–32)
Calcium: 7 mg/dL — ABNORMAL LOW (ref 8.9–10.3)
Chloride: 109 mmol/L (ref 98–111)
Creatinine, Ser: 0.43 mg/dL — ABNORMAL LOW (ref 0.44–1.00)
GFR calc Af Amer: >60 mL/min (ref >60)
GFR calc non Af Amer: >60 mL/min (ref >60)
GLUCOSE: 93 mg/dL (ref 70–99)
Potassium: 3.1 mmol/L — ABNORMAL LOW (ref 3.5–5.1)
SODIUM: 140 mmol/L (ref 135–145)

## 2018-08-01 MED ORDER — POTASSIUM CHLORIDE CRYS ER 20 MEQ PO TBCR: 40.0000 meq | EXTENDED_RELEASE_TABLET | Freq: Every day | ORAL | 0 refills | Status: AC

## 2018-08-01 MED ORDER — POTASSIUM CHLORIDE CRYS ER 20 MEQ PO TBCR
40.0000 meq | EXTENDED_RELEASE_TABLET | ORAL | Status: DC
  Administered 2018-08-01: 40 meq via ORAL
  Filled 2018-08-01 (×2): qty 2

## 2018-08-01 NOTE — Progress Notes (Signed)
Spoke with Dr. Irine Seal, internal medicine covering MD for patient. He agrees with plan for DC given appropriate rise in K to 3.1.  He recommends:  40 meq KCL x 2 doses, one now and one @ 1300. Discharge after that 1300 dose. Discahrge with 40meq KCL daily x 1 week Fu potassium at [facility] visit.  FU with PCP.    Arty Baumgartner MD

## 2018-08-01 NOTE — Lactation Note (Signed)
This note was copied from a baby's chart. Lactation Consultation Note  Patient Name: Boy Tavonna Worthington [id number] Date: 08/01/2018 Reason for consult: Follow-up assessment;Late-preterm 34-36.6wks;Infant weight loss  4 hours old LPI who is being partially BF and formula fed by his mother, she's a P4 and experienced BF. Mom and baby are going home today, reviewed discharge instructions, engorgement prevention and treatment as well as treatment for sore nipples. Mom understands that it's going to take a few more weeks for baby to catch up developmentally and that he may not be able to empty the breast once her milk comes in. Advised mom to pump after feedings and to offer baby any amount of EBM she may get before offering Similac formula, and to use it only to complete volumes required for supplementation. Mom has a DEBP at home. She's still on enteric precautions, she states BF is going well and that she's been been able to hear baby swallowing at the breast. Mom reported all questions and concerns were answered, she's aware of [place] OP services and will call contact PRN.  Maternal Data    Feeding      Interventions Interventions: Breast feeding basics reviewed  Lactation Tools Discussed/Used     Consult Status Consult Status: Complete Date: 08/01/18 Follow-up type: Call as needed    Zaccheus Edmister Francene Boyers 08/01/2018, 12:43 PM

## 2018-08-01 NOTE — Progress Notes (Signed)
Patient discharged with printed instructions. Pt verbalized an understanding. No concerns noted. Nalaysia Manganiello L Bobi Daudelin, RN 

## 2018-08-01 NOTE — Progress Notes (Signed)
Confidential voice mail left for clinical social worker, [facility] due to patient's score of 11 on Edinburgh screening. Will await call back. Toya Smothers, RN

## 2018-08-01 NOTE — Discharge Summary (Addendum)
Obstetric Discharge Summary Reason for Admission: induction of labor and PIH w/severe features.  **PLEASE SEE H&P for detailed information** 33 yo presented with N/V/D found to have GI illness in addition to [facility] w/severe features. One dose of BMZ was given and Magnesium was started.She was induced and had an uncomplicated NSVD. PP course c/b profound hypokalemia unresponsive to large repletion requiring internal med c/s to reach readable potassium level. On PPD#2, K improved to 3.1 with plan for continued supplementation as an outpatient. BP mostly normal range with occasional 130-140s/90s.  Prenatal Procedures: Preeclampsia Intrapartum Procedures: spontaneous vaginal delivery Postpartum Procedures: profound hypokalemia Complications-Operative and Postpartum: 1st degree perineal laceration and labial laceration Hemoglobin  Date Value Ref Range Status  07/31/2018 9.5 (L) 12.0 - 15.0 g/dL Final   HCT  Date Value Ref Range Status  07/31/2018 30.7 (L) 36.0 - 46.0 % Final    Physical Exam:  General: alert, cooperative and appears stated age 33: appropriate Uterine Fundus: firm Incision: healing well, no significant drainage, no dehiscence, no significant erythema DVT Evaluation: No evidence of DVT seen on physical exam. Negative Homan's sign. No cords or calf tenderness. No significant calf/ankle edema.  Discharge Diagnoses: Preelampsia and NSVD  Discharge Information: Date: 08/01/2018 Activity: pelvic rest Diet: routine Medications: Kcl x 7 days Condition: stable Instructions: refer to practice specific booklet Discharge to: home  **Patient to fu on Wednesday for BP check and Potassium check at 21 for [facility]. If KCL WNL on Wednesday, can d/c PO potassium.   **Hx of PP Eclampsia - patient was given strict precautions for returning. Plan BP check as above. No meds required for now. Allergies as of 08/01/2018   No Known Allergies     Medication List     TAKE these medications   ferrous sulfate 325 (65 FE) MG tablet Take 1 tablet (325 mg total) by mouth 3 (three) times daily with meals.   potassium chloride SA 20 MEQ tablet Commonly known as:  K-DUR,KLOR-CON Take 2 tablets (40 mEq total) by mouth daily for 7 days.   PRENATAL GUMMIES/DHA & FA 0.4-32.5 MG Chew Chew 1 each by mouth daily.        Newborn Data: Live born female  Birth Weight: 7 lb 1.4 oz (3215 g) APGAR: 9, 9  Newborn Delivery   Birth date/time:  07/30/2018 17:07:00 Delivery type:  Vaginal, Spontaneous     Home with mother.  [name] 08/01/2018, 8:23 AM

## 2018-08-01 NOTE — Progress Notes (Signed)
Patient was seen in consultation by my partner, Dr. Cruzita Lederer on 07/31/2018 for profound/severe hypokalemia felt secondary to GI losses from recent viral GI illness with profuse nausea vomiting and diarrhea.  Potassium at time of consultation was less than 2.  Potassium aggressively repleted and potassium currently at 3.1 this morning. Spoke with patient's attending Dr. Royston Sinner who met stated that patient's potassium had improved significantly from time of consultation and patient was progressing well to be discharged later on today.  We will give K. Dur 40 mEq p.o. every 4 hours x2 doses today.  Recommendation on discharging on K. Dur 40 mEq daily until outpatient follow-up at which point in time basic metabolic profile need to be repeated to follow-up on hypokalemia and further recommendations can be made at that time.  Prescription for K. Dur 40 mEq daily x7 days has been placed on discharge medications. Thank you for your time.  Do not hesitate to call with any questions.  Will sign off.  No charge.

## 2018-08-01 NOTE — Progress Notes (Signed)
Dr. Royston Sinner called updated on BP 130's/80's, hasn't had diarrhea since after breakfast.  NSR.

## 2018-08-05 ENCOUNTER — Inpatient Hospital Stay (HOSPITAL_COMMUNITY): Payer: 59

## 2018-08-05 ENCOUNTER — Inpatient Hospital Stay (HOSPITAL_COMMUNITY)
Admission: AD | Admit: 2018-08-05 | Discharge: 2018-08-07 | DRG: 776 | Disposition: A | Discharge status: Home / Self Care | Payer: 59 | Attending: Obstetrics and Gynecology | Admitting: Obstetrics and Gynecology

## 2018-08-05 ENCOUNTER — Encounter (HOSPITAL_COMMUNITY): Payer: Self-pay | Admitting: *Deleted

## 2018-08-05 ENCOUNTER — Other Ambulatory Visit: Payer: Self-pay

## 2018-08-05 DIAGNOSIS — O1495 Unspecified pre-eclampsia, complicating the puerperium: Secondary | ICD-10-CM

## 2018-08-05 DIAGNOSIS — O159 Eclampsia, unspecified as to time period: Secondary | ICD-10-CM

## 2018-08-05 DIAGNOSIS — O1415 Severe pre-eclampsia, complicating the puerperium: Secondary | ICD-10-CM | POA: Diagnosis not present

## 2018-08-05 LAB — CBC
HCT: 32.4 % — ABNORMAL LOW (ref 36.0–46.0)
Hemoglobin: 9.9 g/dL — ABNORMAL LOW (ref 12.0–15.0)
MCH: 22.7 pg — ABNORMAL LOW (ref 26.0–34.0)
MCHC: 30.6 g/dL (ref 30.0–36.0)
MCV: 74.1 fL — ABNORMAL LOW (ref 80.0–100.0)
Platelets: 268 10*3/uL (ref 150–400)
RBC: 4.37 MIL/uL (ref 3.87–5.11)
RDW: 25.5 % — ABNORMAL HIGH (ref 11.5–15.5)
WBC: 6.7 10*3/uL (ref 4.0–10.5)
nRBC: 0 % (ref 0.0–0.2)

## 2018-08-05 LAB — COMPREHENSIVE METABOLIC PANEL
ALK PHOS: 100 U/L (ref 38–126)
ALT: 53 U/L — ABNORMAL HIGH (ref 0–44)
AST: 41 U/L (ref 15–41)
Albumin: 2.9 g/dL — ABNORMAL LOW (ref 3.5–5.0)
Anion gap: 10 (ref 5–15)
BUN: <5 mg/dL — ABNORMAL LOW (ref 6–20)
CO2: 24 mmol/L (ref 22–32)
Calcium: 7.8 mg/dL — ABNORMAL LOW (ref 8.9–10.3)
Chloride: 109 mmol/L (ref 98–111)
Creatinine, Ser: 0.45 mg/dL (ref 0.44–1.00)
GFR calc non Af Amer: >60 mL/min (ref >60)
Glucose, Bld: 76 mg/dL (ref 70–99)
Potassium: 3.1 mmol/L — ABNORMAL LOW (ref 3.5–5.1)
SODIUM: 143 mmol/L (ref 135–145)
Total Bilirubin: 0.9 mg/dL (ref 0.3–1.2)
Total Protein: 6.6 g/dL (ref 6.5–8.1)

## 2018-08-05 LAB — PROTEIN / CREATININE RATIO, URINE
Creatinine, Urine: 27 mg/dL
Protein Creatinine Ratio: 0.26 mg/mg{Cre} — ABNORMAL HIGH (ref 0.00–0.15)
Total Protein, Urine: 7 mg/dL

## 2018-08-05 LAB — MAGNESIUM: MAGNESIUM: 1.8 mg/dL (ref 1.7–2.4)

## 2018-08-05 LAB — TROPONIN I: Troponin I: <0.03 ng/mL (ref <0.03)

## 2018-08-05 MED ORDER — MAGNESIUM SULFATE 40 G IN LACTATED RINGERS - SIMPLE
2.0000 g/h | INTRAVENOUS | Status: AC
  Administered 2018-08-05 – 2018-08-06 (×2): 2 g/h via INTRAVENOUS
  Filled 2018-08-05 (×2): qty 500

## 2018-08-05 MED ORDER — LABETALOL HCL 5 MG/ML IV SOLN: 20.0000 mg | INTRAVENOUS | Status: DC | PRN

## 2018-08-05 MED ORDER — HYDRALAZINE HCL 20 MG/ML IJ SOLN: 10.0000 mg | INTRAMUSCULAR | Status: DC | PRN

## 2018-08-05 MED ORDER — LABETALOL HCL 5 MG/ML IV SOLN: 40.0000 mg | INTRAVENOUS | Status: DC | PRN

## 2018-08-05 MED ORDER — HYDRALAZINE HCL 20 MG/ML IJ SOLN: 5.0000 mg | INTRAMUSCULAR | Status: DC | PRN

## 2018-08-05 MED ORDER — POTASSIUM CHLORIDE CRYS ER 20 MEQ PO TBCR
20.0000 meq | EXTENDED_RELEASE_TABLET | Freq: Two times a day (BID) | ORAL | Status: DC
  Administered 2018-08-05 – 2018-08-06 (×2): 20 meq via ORAL
  Filled 2018-08-05 (×2): qty 1

## 2018-08-05 MED ORDER — NIFEDIPINE 10 MG PO CAPS: 20.0000 mg | ORAL_CAPSULE | ORAL | Status: DC | PRN

## 2018-08-05 MED ORDER — HYDRALAZINE HCL 20 MG/ML IJ SOLN
10.0000 mg | INTRAMUSCULAR | Status: DC | PRN
  Administered 2018-08-05: 10 mg via INTRAVENOUS
  Filled 2018-08-05: qty 1

## 2018-08-05 MED ORDER — LACTATED RINGERS IV SOLN
INTRAVENOUS | Status: DC
  Administered 2018-08-05 – 2018-08-06 (×3): via INTRAVENOUS

## 2018-08-05 MED ORDER — NIFEDIPINE 10 MG PO CAPS: 10.0000 mg | ORAL_CAPSULE | ORAL | Status: DC | PRN

## 2018-08-05 MED ORDER — ACETAMINOPHEN 325 MG PO TABS
650.0000 mg | ORAL_TABLET | Freq: Four times a day (QID) | ORAL | Status: DC | PRN
  Administered 2018-08-05 – 2018-08-06 (×4): 650 mg via ORAL
  Filled 2018-08-05 (×4): qty 2

## 2018-08-05 MED ORDER — FUROSEMIDE 10 MG/ML IJ SOLN
20.0000 mg | Freq: Once | INTRAMUSCULAR | Status: AC
  Administered 2018-08-05: 20 mg via INTRAVENOUS
  Filled 2018-08-05 (×2): qty 2

## 2018-08-05 MED ORDER — MAGNESIUM SULFATE BOLUS VIA INFUSION
4.0000 g | Freq: Once | INTRAVENOUS | Status: AC
  Administered 2018-08-05: 4 g via INTRAVENOUS
  Filled 2018-08-05: qty 500

## 2018-08-05 MED ORDER — HYDRALAZINE HCL 20 MG/ML IJ SOLN
5.0000 mg | INTRAMUSCULAR | Status: DC | PRN
  Administered 2018-08-05: 5 mg via INTRAVENOUS
  Filled 2018-08-05: qty 1

## 2018-08-05 MED ORDER — LABETALOL HCL 5 MG/ML IV SOLN
20.0000 mg | INTRAVENOUS | Status: DC | PRN
  Administered 2018-08-05: 20 mg via INTRAVENOUS
  Filled 2018-08-05: qty 4

## 2018-08-05 NOTE — MAU Note (Signed)
Feeling short of breath.  Started 2 days ago.  Feels really tight.no cough or fever.  Vag del 1/24. Feet are still puffy.  (wt up 5kg since adm for del)

## 2018-08-05 NOTE — MAU Provider Note (Signed)
Patient April Lucas is a 33 y.o. [phone] At 6 days postpartum here with complaints of shortness of breath.    Patient was admitted on 07-30-2018 after presenting in MAU with GI complaints (NV, and diarrhea). She found to have severe range pressures and diagnosed with pre-e with severe features. She was started on MgSo4, induced with AROM and pitocin and had an NSVD on 07-30-2018.  She was found to have hypokalemia, and give IV and oral potassium while in the hospital. She was discharged home with oral potassium and told to follow up in the office on 08-10-2018 (per patient), although D/C note says that she was to follow up on Wednesday, January 29.   Her pregnancy was complicated by GDM; she had a history of pre-e in her previous pregnancies as well as pp pre-eclamptic seizure.    History     CSN: [id number]  Arrival date and time: 08/05/18 1516   First Provider Initiated Contact with Patient 08/05/18 1543      Chief Complaint  Patient presents with  . Shortness of Breath   Shortness of Breath  This is a new problem. The current episode started yesterday. The problem occurs constantly. The problem has been unchanged. Associated symptoms include chest pain. Pertinent negatives include no abdominal pain or headaches.  She denies blurry vision, RUQ, headache or floating spots. She has been taking her K-dur BID as recommended. She does feel that her feet are swollen, but she denies overall swelling.   OB History    Gravida  5   Para  4   Term  2   Preterm  2   AB  1   Living  4     SAB  1   TAB      Ectopic      Multiple  0   Live Births  4           Past Medical History:  Diagnosis Date  . Anemia   . Anxiety   . Diabetes mellitus without complication (HCC)    GDM diet controlled; 2nd pg only  . Fibroid   . Hx of varicella   . Hypertension    Gestational hypertension only  . Miscarriage 04/2015  . Obesity     Past Surgical History:  Procedure  Laterality Date  . extraction of wisdom teeth      Family History  Problem Relation Age of Onset  . Hypertension Other   . Diabetes Other   . Hypertension Father   . Diabetes Father   . Other Neg Hx   . Hearing loss Neg Hx     Social History   Tobacco Use  . Smoking status: Former Research scientist (life sciences)  . Smokeless tobacco: Never Used  . Tobacco comment: as teenager  Substance Use Topics  . Alcohol use: No    Alcohol/week: 3.0 standard drinks    Types: 3 Glasses of wine per week    Comment: not with preg  . Drug use: No    Allergies: No Known Allergies  Medications Prior to Admission  Medication Sig Dispense Refill Last Dose  . ferrous sulfate 325 (65 FE) MG tablet Take 1 tablet (325 mg total) by mouth 3 (three) times daily with meals. 90 tablet 3 Past Week at Unknown time  . potassium chloride SA (K-DUR,KLOR-CON) 20 MEQ tablet Take 2 tablets (40 mEq total) by mouth daily for 7 days. 14 tablet 0   . Prenatal MV-Min-FA-Omega-3 (PRENATAL GUMMIES/DHA & FA) 0.4-32.5 MG CHEW  Chew 1 each by mouth daily.   Past Week at Unknown time    Review of Systems  Constitutional: Negative.   HENT: Negative.   Respiratory: Positive for shortness of breath.   Cardiovascular: Positive for chest pain.  Gastrointestinal: Negative for abdominal pain.  Genitourinary: Negative.   Neurological: Negative for headaches.   Physical Exam   Blood pressure (!) 171/91, pulse (!) 52, temperature 98.4 F (36.9 C), resp. rate 20, weight 110.7 kg, SpO2 98 %, currently breastfeeding.  Physical Exam  Constitutional: She is oriented to person, place, and time. She appears well-developed and well-nourished.  HENT:  Head: Normocephalic.  Neck: Normal range of motion.  Cardiovascular: Normal rate.  Respiratory: Effort normal. No respiratory distress. She has no wheezes. She exhibits no tenderness.  GI: Soft.  Musculoskeletal: Normal range of motion.        General: Edema present.     Comments: 2+ pitting edema   Neurological: She is alert and oriented to person, place, and time.  Skin: Skin is warm and dry.    MAU Course  Procedures  MDM -Patient is alert and oriented times 3 in bed; reports that she feels fine.  -She has had large void while in MAU x 2.   -EKG normal -Chest x-ray normal -IV hydralazine at 1623 and 1720. -Patient's bp did not respond to IV hydralazine at 1720; received 20 of labetalol IV at 1817.  -Patient OOB to bathroom; feeling well. Denies S/S of pre-e.   Discussed with Dr. Rosana Hoes, who recommends admission.  Discussed with Dr. Helane Rima, she will be readmitted and given Magnesium, repeat labs in the morning, Lasix now. Continue K+ replacement. Pre-clamptic protocol in place; orders are placed in case patient continues to require IV anti-hypertensives.     Assessment and Plan  -Admit to 3rd floor; patient is exclusively breastfeeding so infant will be able to stay as well.   Mervyn Skeeters Jacqualine Weichel 08/05/2018, 5:44 PM

## 2018-08-05 NOTE — H&P (Signed)
33 year old status post NSVD on 1/24 for severe preeclampsia (History of Eclampsia previous pregnancy) admitted with hypertension, edema and slightly abnormal LFTs. Patient admitted. She was given 20 mg Lasix IV and Magnesium started. BP is improved. BP 132/63   Pulse 80   Temp 98.7 F (37.1 C) (Oral)   Resp 19   Ht 5\' 3"  (1.6 m)   Wt 110.7 kg   SpO2 98%   Breastfeeding Yes Comment: "he's perfect"  BMI 43.23 kg/m  Results for orders placed or performed during the hospital encounter of 08/05/18 (from the past 24 hour(s))  CBC     Status: Abnormal   Collection Time: 08/05/18  3:57 PM  Result Value Ref Range   WBC 6.7 4.0 - 10.5 K/uL   RBC 4.37 3.87 - 5.11 MIL/uL   Hemoglobin 9.9 (L) 12.0 - 15.0 g/dL   HCT 32.4 (L) 36.0 - 46.0 %   MCV 74.1 (L) 80.0 - 100.0 fL   MCH 22.7 (L) 26.0 - 34.0 pg   MCHC 30.6 30.0 - 36.0 g/dL   RDW 25.5 (H) 11.5 - 15.5 %   Platelets 268 150 - 400 K/uL   nRBC 0.0 0.0 - 0.2 %  Troponin I - ONCE - STAT     Status: None   Collection Time: 08/05/18  4:07 PM  Result Value Ref Range   Troponin I <0.03 <0.03 ng/mL  Magnesium     Status: None   Collection Time: 08/05/18  4:07 PM  Result Value Ref Range   Magnesium 1.8 1.7 - 2.4 mg/dL  Comprehensive metabolic panel     Status: Abnormal   Collection Time: 08/05/18  4:07 PM  Result Value Ref Range   Sodium 143 135 - 145 mmol/L   Potassium 3.1 (L) 3.5 - 5.1 mmol/L   Chloride 109 98 - 111 mmol/L   CO2 24 22 - 32 mmol/L   Glucose, Bld 76 70 - 99 mg/dL   BUN <5 (L) 6 - 20 mg/dL   Creatinine, Ser 0.45 0.44 - 1.00 mg/dL   Calcium 7.8 (L) 8.9 - 10.3 mg/dL   Total Protein 6.6 6.5 - 8.1 g/dL   Albumin 2.9 (L) 3.5 - 5.0 g/dL   AST 41 15 - 41 U/L   ALT 53 (H) 0 - 44 U/L   Alkaline Phosphatase 100 38 - 126 U/L   Total Bilirubin 0.9 0.3 - 1.2 mg/dL   GFR calc non Af Amer >60 >60 mL/min   GFR calc Af Amer >60 >60 mL/min   Anion gap 10 5 - 15  Protein / creatinine ratio, urine     Status: Abnormal   Collection  Time: 08/05/18  4:37 PM  Result Value Ref Range   Creatinine, Urine 27.00 mg/dL   Total Protein, Urine 7 mg/dL   Protein Creatinine Ratio 0.26 (H) 0.00 - 0.15 mg/mg[Cre]   Lung CTAB Car RRR Abdomen soft and non tender Edema +1 pitting lower extremitites  IMPRESSION Post partum preeclampsia Admit for observation Control BPs Magnesium Recheck labs in am

## 2018-08-06 ENCOUNTER — Encounter (HOSPITAL_COMMUNITY): Payer: Self-pay

## 2018-08-06 LAB — CBC
HCT: 29.8 % — ABNORMAL LOW (ref 36.0–46.0)
Hemoglobin: 9.5 g/dL — ABNORMAL LOW (ref 12.0–15.0)
MCH: 22.9 pg — ABNORMAL LOW (ref 26.0–34.0)
MCHC: 31.9 g/dL (ref 30.0–36.0)
MCV: 71.8 fL — ABNORMAL LOW (ref 80.0–100.0)
PLATELETS: 301 10*3/uL (ref 150–400)
RBC: 4.15 MIL/uL (ref 3.87–5.11)
RDW: 25.4 % — ABNORMAL HIGH (ref 11.5–15.5)
WBC: 6.5 10*3/uL (ref 4.0–10.5)

## 2018-08-06 LAB — COMPREHENSIVE METABOLIC PANEL
ALBUMIN: 2.8 g/dL — AB (ref 3.5–5.0)
ALT: 52 U/L — ABNORMAL HIGH (ref 0–44)
AST: 42 U/L — ABNORMAL HIGH (ref 15–41)
Alkaline Phosphatase: 95 U/L (ref 38–126)
Anion gap: 12 (ref 5–15)
BUN: <5 mg/dL — ABNORMAL LOW (ref 6–20)
CHLORIDE: 102 mmol/L (ref 98–111)
CO2: 24 mmol/L (ref 22–32)
Calcium: 7.4 mg/dL — ABNORMAL LOW (ref 8.9–10.3)
Creatinine, Ser: 0.55 mg/dL (ref 0.44–1.00)
GFR calc Af Amer: >60 mL/min (ref >60)
GFR calc non Af Amer: >60 mL/min (ref >60)
GLUCOSE: 97 mg/dL (ref 70–99)
Potassium: 2.6 mmol/L — CL (ref 3.5–5.1)
Sodium: 138 mmol/L (ref 135–145)
Total Bilirubin: 0.7 mg/dL (ref 0.3–1.2)
Total Protein: 6.2 g/dL — ABNORMAL LOW (ref 6.5–8.1)

## 2018-08-06 MED ORDER — POTASSIUM CHLORIDE CRYS ER 20 MEQ PO TBCR
40.0000 meq | EXTENDED_RELEASE_TABLET | Freq: Two times a day (BID) | ORAL | Status: DC
  Administered 2018-08-06 – 2018-08-07 (×2): 40 meq via ORAL
  Filled 2018-08-06 (×2): qty 2

## 2018-08-06 MED ORDER — POTASSIUM CHLORIDE 10 MEQ/100ML IV SOLN
10.0000 meq | Freq: Once | INTRAVENOUS | Status: AC
  Administered 2018-08-06: 10 meq via INTRAVENOUS
  Filled 2018-08-06: qty 100

## 2018-08-06 MED ORDER — NIFEDIPINE ER OSMOTIC RELEASE 30 MG PO TB24
30.0000 mg | ORAL_TABLET | Freq: Two times a day (BID) | ORAL | Status: DC
  Administered 2018-08-06 – 2018-08-07 (×2): 30 mg via ORAL
  Filled 2018-08-06 (×2): qty 1

## 2018-08-06 NOTE — Lactation Note (Signed)
Lactation Consultation Note: Follow up visit with this mom who was readmitted due to high BP. Mom reports baby was been nursing well at home. Has pumped this morning and obtained about 3 oz mature milk. Labeled and placed in refrigerator on unit by RN. Reviewed cleaning of pump pieces- given basins and soap. No questions at present. Reviewed our phone number to call with questions/concerns.   Patient Name: April Lucas Today's Date: 08/06/2018     Maternal Data    Feeding    LATCH Score                   Interventions    Lactation Tools Discussed/Used     Consult Status      Truddie Crumble 08/06/2018, 9:32 AM

## 2018-08-06 NOTE — Progress Notes (Signed)
-  Late entry-  No current c/o.  Patient reports shortness of breath has completely resolved with diuresis.  She denies HA, CP/SOB, RUQ pain or visual disturbance.    BPs mild range since midnight.   UOP 4600 cc since admit PreE labs wnl except slight elevation in AST (42) and ALT (52) K+ 2.6  Gen: A&O x 3 Abd: soft, NT Ext: 1+edema, 1+DTRs and no clonus  PPD7/HD2 Postpartum pre-eclampsia  -Continue magnesium until 1845 tonight -Repeat labs in am -Replete K+  Linda Hedges, DO

## 2018-08-06 NOTE — Progress Notes (Signed)
CRITICAL VALUE ALERT  Critical Value: Potassium 2.6  Date & Time Notied: 08-06-18 @  0608   Provider Notified: Dr. Helane Rima   Orders Received/Actions taken: IVPB Potassium

## 2018-08-07 LAB — COMPREHENSIVE METABOLIC PANEL
ALT: 39 U/L (ref 0–44)
AST: 19 U/L (ref 15–41)
Albumin: 2.9 g/dL — ABNORMAL LOW (ref 3.5–5.0)
Alkaline Phosphatase: 95 U/L (ref 38–126)
Anion gap: 8 (ref 5–15)
CO2: 26 mmol/L (ref 22–32)
CREATININE: 0.52 mg/dL (ref 0.44–1.00)
Calcium: 7.3 mg/dL — ABNORMAL LOW (ref 8.9–10.3)
Chloride: 101 mmol/L (ref 98–111)
Glucose, Bld: 88 mg/dL (ref 70–99)
Potassium: 3.1 mmol/L — ABNORMAL LOW (ref 3.5–5.1)
Sodium: 135 mmol/L (ref 135–145)
Total Bilirubin: 1.1 mg/dL (ref 0.3–1.2)
Total Protein: 6.1 g/dL — ABNORMAL LOW (ref 6.5–8.1)

## 2018-08-07 MED ORDER — NIFEDIPINE ER 30 MG PO TB24: 30.0000 mg | ORAL_TABLET | Freq: Two times a day (BID) | ORAL | 1 refills | Status: AC

## 2018-08-07 NOTE — Discharge Summary (Signed)
Physician Discharge Summary  Patient ID: April Lucas MRN: [id number] DOB/AGE: October 30, 1985 33 y.o.  Admit date: 08/05/2018 Discharge date: 08/07/2018  Admission Diagnoses: Postpartum pre-eclampsia with severe features  Discharge Diagnoses: SAA  Discharged Condition: good  Hospital Course: Patient was admitted with complaint of shortness of breath and severe range BPs.  She has significant PMH of eclampsia in her previous postpartum period.  She was given IV antihypertensives and lasix and was started on magnesium sulfate.  After completion of 24 hours of magnesium, the patient was started on Procardia 30 XL BID which she tolerated well and controlled her BPs. Hypokalemia was repleted. On HD 3, patient felt better and BPs were well controlled.  She was discharged home with plans for office f/u on Wednesday.     Consults: None  Significant Diagnostic Studies: n/a  Treatments: magnesium sulfate  Discharge Exam: Blood pressure (!) 136/95, pulse 87, temperature 98.9 F (37.2 C), temperature source Oral, resp. rate 18, height 5\' 3"  (1.6 m), weight 110.7 kg, SpO2 99 %, currently breastfeeding. General appearance: alert, cooperative and appears stated age Chest wall: Pulm CTAB Cardio: regular rate and rhythm, S1, S2 normal, no murmur, click, rub or gallop Extremities: extremities normal, atraumatic, no cyanosis or edema and 1+ DTRs  Disposition: Discharge disposition: 01-Home or Self Care        Allergies as of 08/07/2018   No Known Allergies     Medication List    TAKE these medications   ferrous sulfate 325 (65 FE) MG tablet Take 1 tablet (325 mg total) by mouth 3 (three) times daily with meals. What changed:  when to take this   NIFEdipine 30 MG 24 hr tablet Commonly known as:  ADALAT CC Take 1 tablet (30 mg total) by mouth 2 (two) times daily.   potassium chloride SA 20 MEQ tablet Commonly known as:  K-DUR,KLOR-CON Take 2 tablets (40 mEq total) by mouth daily for 7  days.   PRENATAL GUMMIES/DHA & FA 0.4-32.5 MG Chew Chew 1 each by mouth daily.        Signed: Linda Hedges 08/07/2018, 8:53 AM

## 2018-08-07 NOTE — Lactation Note (Signed)
Lactation Consultation Note: Follow up visit with this mom who was readmitted due to high blood pressure. Dad and baby have spent the night with mom and she has nursed baby through the night. Reports baby just finished nursing for 15 min. Reports he has remembered how to nurse and she is pleased about that. Was worried he wouldn't want to nurse after getting bottles of formula.Hopes for DC today. No questions at present. To call prn   Patient Name: April Lucas [id number] Date: 08/07/2018     Maternal Data    Feeding    LATCH Score                   Interventions    Lactation Tools Discussed/Used     Consult Status      Truddie Crumble 08/07/2018, 8:10 AM

## 2018-08-07 NOTE — Discharge Instructions (Signed)
Call MD for HA, CP/SOB, RUQ pain, or visual disturbance.  Follow up in office at already scheduled appointment on Wednesday.

## 2018-08-07 NOTE — Progress Notes (Signed)
Discharge instructions and prescriptions given to pt. Discussed signs and symptoms to report to the MD, upcoming appointments, and medication regimen. Pt verbalizes understanding and has no questions or concerns at this time. Pt discharged from hospital in stable condition.

## 2018-08-09 ENCOUNTER — Telehealth: Payer: Self-pay | Admitting: Student

## 2018-08-09 ENCOUNTER — Other Ambulatory Visit: Payer: Self-pay

## 2018-08-09 ENCOUNTER — Inpatient Hospital Stay (HOSPITAL_COMMUNITY)
Admission: AD | Admit: 2018-08-09 | Discharge: 2018-08-09 | Disposition: A | Discharge status: Home / Self Care | Payer: 59 | Attending: Obstetrics and Gynecology | Admitting: Obstetrics and Gynecology

## 2018-08-09 DIAGNOSIS — Z87891 Personal history of nicotine dependence: Secondary | ICD-10-CM | POA: Diagnosis not present

## 2018-08-09 DIAGNOSIS — R69 Illness, unspecified: Secondary | ICD-10-CM | POA: Diagnosis not present

## 2018-08-09 DIAGNOSIS — J111 Influenza due to unidentified influenza virus with other respiratory manifestations: Secondary | ICD-10-CM

## 2018-08-09 DIAGNOSIS — O864 Pyrexia of unknown origin following delivery: Secondary | ICD-10-CM | POA: Insufficient documentation

## 2018-08-09 DIAGNOSIS — R0602 Shortness of breath: Secondary | ICD-10-CM | POA: Diagnosis present

## 2018-08-09 LAB — CBC
HCT: 40.3 % (ref 36.0–46.0)
Hemoglobin: 12.5 g/dL (ref 12.0–15.0)
MCH: 23 pg — AB (ref 26.0–34.0)
MCHC: 31 g/dL (ref 30.0–36.0)
MCV: 74.1 fL — ABNORMAL LOW (ref 80.0–100.0)
PLATELETS: 263 10*3/uL (ref 150–400)
RBC: 5.44 MIL/uL — ABNORMAL HIGH (ref 3.87–5.11)
RDW: 25 % — ABNORMAL HIGH (ref 11.5–15.5)
WBC: 11.3 10*3/uL — ABNORMAL HIGH (ref 4.0–10.5)
nRBC: 0 % (ref 0.0–0.2)

## 2018-08-09 LAB — URINALYSIS, ROUTINE W REFLEX MICROSCOPIC
GLUCOSE, UA: NEGATIVE mg/dL
Ketones, ur: NEGATIVE mg/dL
Nitrite: POSITIVE — AB
Protein, ur: 100 mg/dL — AB
Specific Gravity, Urine: 1.01 (ref 1.005–1.030)
pH: 7 (ref 5.0–8.0)

## 2018-08-09 LAB — INFLUENZA PANEL BY PCR (TYPE A & B)
Influenza A By PCR: NEGATIVE
Influenza B By PCR: NEGATIVE

## 2018-08-09 LAB — URINALYSIS, MICROSCOPIC (REFLEX): WBC, UA: >50 WBC/hpf (ref 0–5)

## 2018-08-09 LAB — GROUP A STREP BY PCR: Group A Strep by PCR: NOT DETECTED

## 2018-08-09 MED ORDER — CYCLOBENZAPRINE HCL 10 MG PO TABS: 10.0000 mg | ORAL_TABLET | Freq: Two times a day (BID) | ORAL | 0 refills | Status: AC | PRN

## 2018-08-09 MED ORDER — CYCLOBENZAPRINE HCL 10 MG PO TABS
10.0000 mg | ORAL_TABLET | Freq: Once | ORAL | Status: AC
  Administered 2018-08-09: 10 mg via ORAL
  Filled 2018-08-09: qty 1

## 2018-08-09 MED ORDER — OSELTAMIVIR PHOSPHATE 75 MG PO CAPS: 75.0000 mg | ORAL_CAPSULE | Freq: Two times a day (BID) | ORAL | 0 refills | Status: AC

## 2018-08-09 NOTE — MAU Note (Signed)
Pt presents to MAU with complaints of fever and chills since yesterday. Pt is 10 days post vaginal delivery. Procardia 60mg  XL daily

## 2018-08-09 NOTE — Telephone Encounter (Signed)
Left vm for patient to call back regarding results; left MAU portable phone number and told her that I would be here until 9 pm. Will try again before I leave.   April Lucas

## 2018-08-09 NOTE — Discharge Instructions (Signed)

## 2018-08-09 NOTE — MAU Provider Note (Signed)
History    Patient April Lucas is a 33 y.o.  10 days pp s/p NSVD. She was induced for pre-e with severe features and then induced; she had an NSVD on 1-24. She returned to MAU on 1-30 with shortness of breath; she was still with severe features and so was admitted and given MgSo4. She was discharged on Saturday, February 1st on blood pressure medicine. The day after she returned home, Sunday, she started to have a fever and felt like she was having body aches, chills, sweats. She took her temp and it was 103 (2 pm yesterday). She did not feel better overnight and then came in today to be seen. She tried Tylenol and Motrin and it didn't help.   CSN: [id number]  Arrival date and time: 08/09/18 1455   First Provider Initiated Contact with Patient 08/09/18 1520      Chief Complaint  Patient presents with  . Fever  . Chills   Fever   This is a new problem. The current episode started yesterday. The problem occurs constantly. The problem has been unchanged. The maximum temperature noted was 103 to 103.9 F. The temperature was taken using an oral thermometer. Associated symptoms include muscle aches. Pertinent negatives include no abdominal pain, chest pain, congestion, coughing, diarrhea, sore throat, urinary pain, vomiting or wheezing. She has tried acetaminophen and NSAIDs for the symptoms.  Risk factors: no contaminated food and no sick contacts     OB History    Gravida  5   Para  4   Term  2   Preterm  2   AB  1   Living  4     SAB  1   TAB      Ectopic      Multiple  0   Live Births  4           Past Medical History:  Diagnosis Date  . Anemia   . Anxiety   . Diabetes mellitus without complication (HCC)    GDM diet controlled; 2nd pg only  . Fibroid   . Hx of varicella   . Hypertension    Gestational hypertension only  . Miscarriage 04/2015  . Obesity     Past Surgical History:  Procedure Laterality Date  . extraction of wisdom teeth       Family History  Problem Relation Age of Onset  . Hypertension Other   . Diabetes Other   . Hypertension Father   . Diabetes Father   . Other Neg Hx   . Hearing loss Neg Hx     Social History   Tobacco Use  . Smoking status: Former Research scientist (life sciences)  . Smokeless tobacco: Never Used  . Tobacco comment: as teenager  Substance Use Topics  . Alcohol use: No    Alcohol/week: 3.0 standard drinks    Types: 3 Glasses of wine per week    Comment: not with preg  . Drug use: No    Allergies: No Known Allergies  Medications Prior to Admission  Medication Sig Dispense Refill Last Dose  . ferrous sulfate 325 (65 FE) MG tablet Take 1 tablet (325 mg total) by mouth 3 (three) times daily with meals. (Patient taking differently: Take 325 mg by mouth 2 (two) times daily with a meal. ) 90 tablet 3 08/05/2018 at Unknown time  . NIFEdipine (ADALAT CC) 30 MG 24 hr tablet Take 1 tablet (30 mg total) by mouth 2 (two) times daily. 60 tablet 1   .  potassium chloride SA (K-DUR,KLOR-CON) 20 MEQ tablet Take 2 tablets (40 mEq total) by mouth daily for 7 days. 14 tablet 0 08/05/2018 at Unknown time  . Prenatal MV-Min-FA-Omega-3 (PRENATAL GUMMIES/DHA & FA) 0.4-32.5 MG CHEW Chew 1 each by mouth daily.   Past Week at Unknown time    Review of Systems  Constitutional: Positive for fever.  HENT: Negative for congestion and sore throat.   Respiratory: Negative for cough and wheezing.   Cardiovascular: Negative for chest pain.  Gastrointestinal: Negative for abdominal pain, diarrhea and vomiting.  Genitourinary: Negative for dysuria.   Physical Exam   Blood pressure 139/80, pulse (!) 130, temperature (!) 103 F (39.4 C), resp. rate 16, SpO2 98 %, currently breastfeeding.  Physical Exam  Constitutional: She is oriented to person, place, and time. She appears well-developed and well-nourished.  HENT:  Head: Normocephalic.  Eyes: Pupils are equal, round, and reactive to light.  Respiratory: Effort normal.  GI:  Soft.  Musculoskeletal: Normal range of motion.  Neurological: She is alert and oriented to person, place, and time.  Skin: Skin is warm and dry.    MAU Course  Procedures  MDM -flu test pending  -strep test pending  -CBC pending -Flexeril for pain; patient feels better.  -BP normal on medication Assessment and Plan   1. Influenza-like illness    2. Patient stable for discharge with Strep and Influenza pending  3. Will treat with Tamiflu for 5 days; will notify patient of her results when they are resulted.   4. Advised patient to consult her pediatrician regarding infant care/contact with infant.   Mervyn Skeeters [place]ra 08/09/2018, 3:54 PM

## 2018-08-10 ENCOUNTER — Telehealth: Payer: Self-pay | Admitting: Student

## 2018-08-10 NOTE — Telephone Encounter (Signed)
Left vmail for patient letting her know that flu and strep were negative but that she should continue to take her tamiflu as her symptoms are highly suggestive of flu. Call [facility] or go to urgent care if she does not feel better in 48 hours.  Maye Hides

## 2019-05-03 ENCOUNTER — Other Ambulatory Visit: Payer: Self-pay

## 2019-05-03 ENCOUNTER — Emergency Department (HOSPITAL_COMMUNITY)
Admission: EM | Admit: 2019-05-03 | Discharge: 2019-05-03 | Disposition: A | Discharge status: Home / Self Care | Payer: 59 | Attending: Emergency Medicine | Admitting: Emergency Medicine

## 2019-05-03 ENCOUNTER — Encounter (HOSPITAL_COMMUNITY): Payer: Self-pay | Admitting: Emergency Medicine

## 2019-05-03 DIAGNOSIS — R0602 Shortness of breath: Secondary | ICD-10-CM | POA: Diagnosis not present

## 2019-05-03 DIAGNOSIS — Z5321 Procedure and treatment not carried out due to patient leaving prior to being seen by health care provider: Secondary | ICD-10-CM | POA: Insufficient documentation

## 2019-05-03 DIAGNOSIS — R0789 Other chest pain: Secondary | ICD-10-CM | POA: Diagnosis present

## 2019-05-03 LAB — CBC
HCT: 39.4 % (ref 36.0–46.0)
Hemoglobin: 12.1 g/dL (ref 12.0–15.0)
MCH: 23.8 pg — ABNORMAL LOW (ref 26.0–34.0)
MCHC: 30.7 g/dL (ref 30.0–36.0)
MCV: 77.6 fL — ABNORMAL LOW (ref 80.0–100.0)
Platelets: 333 10*3/uL (ref 150–400)
RBC: 5.08 MIL/uL (ref 3.87–5.11)
RDW: 13.6 % (ref 11.5–15.5)
WBC: 8.6 10*3/uL (ref 4.0–10.5)
nRBC: 0 % (ref 0.0–0.2)

## 2019-05-03 LAB — BASIC METABOLIC PANEL
Anion gap: 10 (ref 5–15)
BUN: 9 mg/dL (ref 6–20)
CO2: 21 mmol/L — ABNORMAL LOW (ref 22–32)
Calcium: 8.9 mg/dL (ref 8.9–10.3)
Chloride: 106 mmol/L (ref 98–111)
Creatinine, Ser: 0.63 mg/dL (ref 0.44–1.00)
GFR calc Af Amer: >60 mL/min (ref >60)
GFR calc non Af Amer: >60 mL/min (ref >60)
Glucose, Bld: 103 mg/dL — ABNORMAL HIGH (ref 70–99)
Potassium: 3.9 mmol/L (ref 3.5–5.1)
Sodium: 137 mmol/L (ref 135–145)

## 2019-05-03 LAB — I-STAT BETA HCG BLOOD, ED (MC, WL, AP ONLY): I-stat hCG, quantitative: <5 m[IU]/mL (ref <5)

## 2019-05-03 LAB — TROPONIN I (HIGH SENSITIVITY): Troponin I (High Sensitivity): <2 ng/L (ref <18)

## 2019-05-03 MED ORDER — SODIUM CHLORIDE 0.9% FLUSH: 3.0000 mL | Freq: Once | INTRAVENOUS | Status: DC

## 2019-05-03 NOTE — ED Notes (Signed)
Pt stated her PCP will see her today and left

## 2019-05-03 NOTE — ED Triage Notes (Signed)
Pt endorses chest tightness since Sunday and SOB. Reports similar feeling when post partum and had swelling but denies swelling today.

## 2021-05-08 DIAGNOSIS — J069 Acute upper respiratory infection, unspecified: Secondary | ICD-10-CM | POA: Diagnosis not present

## 2021-07-19 DIAGNOSIS — J029 Acute pharyngitis, unspecified: Secondary | ICD-10-CM | POA: Diagnosis not present

## 2021-08-06 ENCOUNTER — Other Ambulatory Visit: Payer: Self-pay

## 2021-08-06 ENCOUNTER — Emergency Department (HOSPITAL_BASED_OUTPATIENT_CLINIC_OR_DEPARTMENT_OTHER)
Admission: EM | Admit: 2021-08-06 | Discharge: 2021-08-06 | Disposition: A | Discharge status: Home / Self Care | Payer: BC Managed Care – PPO | Attending: Emergency Medicine | Admitting: Emergency Medicine

## 2021-08-06 ENCOUNTER — Encounter (HOSPITAL_BASED_OUTPATIENT_CLINIC_OR_DEPARTMENT_OTHER): Payer: Self-pay | Admitting: Urology

## 2021-08-06 DIAGNOSIS — T2122XA Burn of second degree of abdominal wall, initial encounter: Secondary | ICD-10-CM | POA: Diagnosis not present

## 2021-08-06 DIAGNOSIS — Z87891 Personal history of nicotine dependence: Secondary | ICD-10-CM | POA: Insufficient documentation

## 2021-08-06 DIAGNOSIS — X19XXXA Contact with other heat and hot substances, initial encounter: Secondary | ICD-10-CM | POA: Diagnosis not present

## 2021-08-06 DIAGNOSIS — W361XXA Explosion and rupture of aerosol can, initial encounter: Secondary | ICD-10-CM

## 2021-08-06 DIAGNOSIS — E119 Type 2 diabetes mellitus without complications: Secondary | ICD-10-CM | POA: Diagnosis not present

## 2021-08-06 DIAGNOSIS — I10 Essential (primary) hypertension: Secondary | ICD-10-CM | POA: Insufficient documentation

## 2021-08-06 MED ORDER — HYDROCODONE-ACETAMINOPHEN 5-325 MG PO TABS
1.0000 | ORAL_TABLET | Freq: Once | ORAL | Status: AC
  Administered 2021-08-06: 1 via ORAL
  Filled 2021-08-06: qty 1

## 2021-08-06 MED ORDER — BACITRACIN ZINC 500 UNIT/GM EX OINT
TOPICAL_OINTMENT | Freq: Once | CUTANEOUS | Status: AC
  Administered 2021-08-06: 1 via TOPICAL
  Filled 2021-08-06: qty 28.35

## 2021-08-06 MED ORDER — HYDROCODONE-ACETAMINOPHEN 5-325 MG PO TABS: 2.0000 | ORAL_TABLET | Freq: Four times a day (QID) | ORAL | 0 refills | Status: AC | PRN

## 2021-08-06 NOTE — ED Notes (Signed)
Sterile guaze soaked in sterile water applied to abdomen and covered with chucks pad
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
## 2021-08-11 DIAGNOSIS — J029 Acute pharyngitis, unspecified: Secondary | ICD-10-CM | POA: Diagnosis not present

## 2021-10-04 DIAGNOSIS — L02415 Cutaneous abscess of right lower limb: Secondary | ICD-10-CM | POA: Diagnosis not present

## 2021-10-06 DIAGNOSIS — L02415 Cutaneous abscess of right lower limb: Secondary | ICD-10-CM | POA: Diagnosis not present

## 2021-10-08 DIAGNOSIS — L02415 Cutaneous abscess of right lower limb: Secondary | ICD-10-CM | POA: Diagnosis not present

## 2021-11-11 ENCOUNTER — Other Ambulatory Visit: Payer: Self-pay

## 2021-11-11 ENCOUNTER — Emergency Department (HOSPITAL_BASED_OUTPATIENT_CLINIC_OR_DEPARTMENT_OTHER)
Admission: EM | Admit: 2021-11-11 | Discharge: 2021-11-11 | Disposition: A | Discharge status: Home / Self Care | Payer: BC Managed Care – PPO | Attending: Emergency Medicine | Admitting: Emergency Medicine

## 2021-11-11 ENCOUNTER — Emergency Department (HOSPITAL_BASED_OUTPATIENT_CLINIC_OR_DEPARTMENT_OTHER): Payer: BC Managed Care – PPO | Admitting: Radiology

## 2021-11-11 ENCOUNTER — Encounter (HOSPITAL_BASED_OUTPATIENT_CLINIC_OR_DEPARTMENT_OTHER): Payer: Self-pay | Admitting: Emergency Medicine

## 2021-11-11 DIAGNOSIS — S90219A Contusion of unspecified great toe with damage to nail, initial encounter: Secondary | ICD-10-CM

## 2021-11-11 DIAGNOSIS — S90211A Contusion of right great toe with damage to nail, initial encounter: Secondary | ICD-10-CM | POA: Insufficient documentation

## 2021-11-11 DIAGNOSIS — Y93E5 Activity, floor mopping and cleaning: Secondary | ICD-10-CM | POA: Insufficient documentation

## 2021-11-11 DIAGNOSIS — M79674 Pain in right toe(s): Secondary | ICD-10-CM | POA: Diagnosis not present

## 2021-11-11 DIAGNOSIS — S99921A Unspecified injury of right foot, initial encounter: Secondary | ICD-10-CM | POA: Diagnosis not present

## 2021-11-11 DIAGNOSIS — W208XXA Other cause of strike by thrown, projected or falling object, initial encounter: Secondary | ICD-10-CM | POA: Diagnosis not present

## 2021-11-11 MED ORDER — LIDOCAINE HCL (PF) 1 % IJ SOLN
5.0000 mL | Freq: Once | INTRAMUSCULAR | Status: AC
  Administered 2021-11-11: 5 mL via INTRADERMAL
  Filled 2021-11-11: qty 5
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
## 2021-11-11 NOTE — Discharge Instructions (Signed)
Contact a health care provider if you have: ?Pain that is not controlled with medicine. ?A fever. ?Redness, swelling, or pain around your nail. ?Get help right away if you have: ?Fluid, blood, or pus coming from your nail. ?

## 2021-11-11 NOTE — ED Provider Notes (Signed)
[facility] EMERGENCY DEPT Provider Note   CSN: [id number] Arrival date & time: 11/11/21  2014     History  Chief Complaint  Patient presents with   Toe Injury    April Lucas is a 36 y.o. female has a chief complaint of right great toe pain.  Yesterday the patient was cleaning her carpet and lifted her couch.  She accidentally Dropped the couch on her right great toe.  She had immediate severe pain.  She has had throbbing pain since that time and came in for further evaluation.  HPI     Home Medications Prior to Admission medications   Medication Sig Start Date End Date Taking? Authorizing Provider  cyclobenzaprine (FLEXERIL) 10 MG tablet Take 1 tablet (10 mg total) by mouth 2 (two) times daily as needed for muscle spasms. 08/09/18   Starr Lake, CNM  ferrous sulfate 325 (65 FE) MG tablet Take 1 tablet (325 mg total) by mouth 3 (three) times daily with meals. Patient taking differently: Take 325 mg by mouth 2 (two) times daily with a meal.  05/12/16   Juanda Chance, NP  HYDROcodone-acetaminophen (NORCO) 5-325 MG tablet Take 2 tablets by mouth every 6 (six) hours as needed for severe pain. 08/06/21   Molpus, John, MD  NIFEdipine (ADALAT CC) 30 MG 24 hr tablet Take 1 tablet (30 mg total) by mouth 2 (two) times daily. 08/07/18   Linda Hedges, DO  oseltamivir (TAMIFLU) 75 MG capsule Take 1 capsule (75 mg total) by mouth 2 (two) times daily. 08/09/18   Starr Lake, CNM  potassium chloride SA (K-DUR,KLOR-CON) 20 MEQ tablet Take 2 tablets (40 mEq total) by mouth daily for 7 days. 08/01/18 08/08/18  Eugenie Filler, MD  Prenatal MV-Min-FA-Omega-3 (PRENATAL GUMMIES/DHA & FA) 0.4-32.5 MG CHEW Chew 1 each by mouth daily.    [provider]      Allergies    Ibuprofen    Review of Systems   Review of Systems  Physical Exam Updated Vital Signs BP 101/67   Pulse 90   Temp 98 F (36.7 C)   Resp 18   Ht '5\' 3"'$  (1.6 m)   Wt 104 kg   LMP  11/04/2021 (Exact Date)   SpO2 100%   BMI 40.61 kg/m  Physical Exam Vitals and nursing note reviewed.  Constitutional:      General: She is not in acute distress.    Appearance: She is well-developed. She is not diaphoretic.  HENT:     Head: Normocephalic and atraumatic.     Right Ear: External ear normal.     Left Ear: External ear normal.     Nose: Nose normal.     Mouth/Throat:     Mouth: Mucous membranes are moist.  Eyes:     General: No scleral icterus.    Conjunctiva/sclera: Conjunctivae normal.  Cardiovascular:     Rate and Rhythm: Normal rate and regular rhythm.     Heart sounds: Normal heart sounds. No murmur heard.   No friction rub. No gallop.  Pulmonary:     Effort: Pulmonary effort is normal. No respiratory distress.     Breath sounds: Normal breath sounds.  Abdominal:     General: Bowel sounds are normal. There is no distension.     Palpations: Abdomen is soft. There is no mass.     Tenderness: There is no abdominal tenderness. There is no guarding.  Musculoskeletal:     Cervical back: Normal range of motion.  Skin:    General: Skin is warm and dry.  Neurological:     Mental Status: She is alert and oriented to person, place, and time.  Psychiatric:        Behavior: Behavior normal.    ED Results / Procedures / Treatments   Labs (all labs ordered are listed, but only abnormal results are displayed) Labs Reviewed - No data to display  EKG None  Radiology DG Foot Complete Right  Result Date: 11/11/2021 CLINICAL DATA:  Toe pain EXAM: RIGHT FOOT COMPLETE - 3+ VIEW COMPARISON:  None Available. FINDINGS: There is no evidence of fracture or dislocation. There is no evidence of arthropathy or other focal bone abnormality. Soft tissues are unremarkable. IMPRESSION: Negative. Electronically Signed   By: Donavan Foil M.D.   On: 11/11/2021 20:51    Procedures .Nail Removal  Date/Time: 11/12/2021 12:24 AM Performed by: Margarita Mail, PA-C Authorized by:  Margarita Mail, PA-C   Consent:    Consent obtained:  Verbal   Risks discussed:  Bleeding Location:    Foot:  R big toe Pre-procedure details:    Skin preparation:  Povidone-iodine Anesthesia:    Anesthesia method:  Nerve block   Block needle gauge:  25 G   Block anesthetic:  Lidocaine 1% w/o epi   Block injection procedure:  Anatomic landmarks identified   Block outcome:  Anesthesia achieved Trephination:    Subungual hematoma drained: yes     Trephination instrument:  Cautery Post-procedure details:    Dressing:  4x4 sterile gauze   Procedure completion:  Tolerated well, no immediate complications    Medications Ordered in ED Medications  lidocaine (PF) (XYLOCAINE) 1 % injection 5 mL (5 mLs Intradermal Given 11/11/21 2122)  lidocaine (PF) (XYLOCAINE) 1 % injection 5 mL (5 mLs Intradermal Given 11/11/21 2122)    ED Course/ Medical Decision Making/ A&P Clinical Course as of 11/12/21 0026  Mon Nov 11, 2021  2119 DG Foot Complete Right I visualized a right foot x-ray.  No acute findings on plain film.  Agree with radiologic interpretation [AH]    Clinical Course User Index [AH] Margarita Mail, PA-C                           Medical Decision Making 36 year old female with a great toe injury on the right side.  I personally visualized the x-ray of the foot which shows no acute findings.  She has a subungual hematoma that I trephinated after blocking the toe.  She had significant improvement in her pain relief and there was a large amount of blood volume under the toenail.  Patient is feeling much better.  Discussed outpatient follow-up, supportive care at home and return precautions.  Amount and/or Complexity of Data Reviewed Radiology: ordered. Decision-making details documented in ED Course.  Risk Prescription drug management.    Final Clinical Impression(s) / ED Diagnoses Final diagnoses:  Subungual hematoma of great toe    Rx / DC Orders ED Discharge Orders      None         Margarita Mail, PA-C 11/12/21 0026    Luna Fuse, MD 11/22/21 2243

## 2021-11-11 NOTE — ED Notes (Signed)
Pt verbalizes understanding of discharge instructions. Opportunity for questioning and answers were provided. Pt discharged from ED to home.   ? ?

## 2021-12-25 DIAGNOSIS — H6121 Impacted cerumen, right ear: Secondary | ICD-10-CM | POA: Diagnosis not present

## 2024-06-06 ENCOUNTER — Emergency Department (HOSPITAL_BASED_OUTPATIENT_CLINIC_OR_DEPARTMENT_OTHER)
Admission: EM | Admit: 2024-06-06 | Discharge: 2024-06-06 | Disposition: A | Discharge status: Home / Self Care | Attending: Emergency Medicine | Admitting: Emergency Medicine

## 2024-06-06 ENCOUNTER — Encounter (HOSPITAL_BASED_OUTPATIENT_CLINIC_OR_DEPARTMENT_OTHER): Payer: Self-pay | Admitting: *Deleted

## 2024-06-06 ENCOUNTER — Emergency Department (HOSPITAL_BASED_OUTPATIENT_CLINIC_OR_DEPARTMENT_OTHER)

## 2024-06-06 ENCOUNTER — Other Ambulatory Visit: Payer: Self-pay

## 2024-06-06 DIAGNOSIS — R059 Cough, unspecified: Secondary | ICD-10-CM | POA: Insufficient documentation

## 2024-06-06 DIAGNOSIS — Z87891 Personal history of nicotine dependence: Secondary | ICD-10-CM | POA: Diagnosis not present

## 2024-06-06 DIAGNOSIS — R1013 Epigastric pain: Secondary | ICD-10-CM | POA: Insufficient documentation

## 2024-06-06 DIAGNOSIS — R0602 Shortness of breath: Secondary | ICD-10-CM | POA: Diagnosis not present

## 2024-06-06 LAB — URINALYSIS, ROUTINE W REFLEX MICROSCOPIC
Bacteria, UA: NONE SEEN
Bilirubin Urine: NEGATIVE
Glucose, UA: NEGATIVE mg/dL
Ketones, ur: NEGATIVE mg/dL
Leukocytes,Ua: NEGATIVE
Nitrite: NEGATIVE
Protein, ur: NEGATIVE mg/dL
Specific Gravity, Urine: 1.006 (ref 1.005–1.030)
pH: 6.5 (ref 5.0–8.0)

## 2024-06-06 LAB — CBC
HCT: 38.4 % (ref 36.0–46.0)
Hemoglobin: 12.2 g/dL (ref 12.0–15.0)
MCH: 23.4 pg — ABNORMAL LOW (ref 26.0–34.0)
MCHC: 31.8 g/dL (ref 30.0–36.0)
MCV: 73.6 fL — ABNORMAL LOW (ref 80.0–100.0)
Platelets: 242 K/uL (ref 150–400)
RBC: 5.22 MIL/uL — ABNORMAL HIGH (ref 3.87–5.11)
RDW: 14 % (ref 11.5–15.5)
WBC: 5 K/uL (ref 4.0–10.5)
nRBC: 0 % (ref 0.0–0.2)

## 2024-06-06 LAB — COMPREHENSIVE METABOLIC PANEL WITH GFR
ALT: 17 U/L (ref 0–44)
AST: 26 U/L (ref 15–41)
Albumin: 4.1 g/dL (ref 3.5–5.0)
Alkaline Phosphatase: 71 U/L (ref 38–126)
Anion gap: 11 (ref 5–15)
BUN: 9 mg/dL (ref 6–20)
CO2: 24 mmol/L (ref 22–32)
Calcium: 9.5 mg/dL (ref 8.9–10.3)
Chloride: 103 mmol/L (ref 98–111)
Creatinine, Ser: 0.53 mg/dL (ref 0.44–1.00)
GFR, Estimated: >60 mL/min (ref >60)
Glucose, Bld: 97 mg/dL (ref 70–99)
Potassium: 4 mmol/L (ref 3.5–5.1)
Sodium: 139 mmol/L (ref 135–145)
Total Bilirubin: 0.5 mg/dL (ref 0.0–1.2)
Total Protein: 7.2 g/dL (ref 6.5–8.1)

## 2024-06-06 LAB — LIPASE, BLOOD: Lipase: 16 U/L (ref 11–51)

## 2024-06-06 LAB — PREGNANCY, URINE: Preg Test, Ur: NEGATIVE

## 2024-06-06 LAB — RESP PANEL BY RT-PCR (RSV, FLU A&B, COVID)  RVPGX2
Influenza A by PCR: NEGATIVE
Influenza B by PCR: NEGATIVE
Resp Syncytial Virus by PCR: NEGATIVE
SARS Coronavirus 2 by RT PCR: NEGATIVE

## 2024-06-06 LAB — PRO BRAIN NATRIURETIC PEPTIDE: Pro Brain Natriuretic Peptide: <50 pg/mL (ref <300.0)

## 2024-06-06 MED ORDER — ONDANSETRON 4 MG PO TBDP: 4.0000 mg | ORAL_TABLET | Freq: Three times a day (TID) | ORAL | 0 refills | Status: AC | PRN

## 2024-06-06 MED ORDER — PANTOPRAZOLE SODIUM 20 MG PO TBEC: 20.0000 mg | DELAYED_RELEASE_TABLET | Freq: Every day | ORAL | 0 refills | Status: AC

## 2024-06-06 MED ORDER — SUCRALFATE 1 G PO TABS: 1.0000 g | ORAL_TABLET | Freq: Three times a day (TID) | ORAL | 0 refills | Status: AC

## 2024-06-06 MED ORDER — PANTOPRAZOLE SODIUM 40 MG IV SOLR
40.0000 mg | Freq: Once | INTRAVENOUS | Status: AC
  Administered 2024-06-06: 40 mg via INTRAVENOUS
  Filled 2024-06-06: qty 10

## 2024-06-06 MED ORDER — SODIUM CHLORIDE 0.9 % IV BOLUS
1000.0000 mL | Freq: Once | INTRAVENOUS | Status: AC
  Administered 2024-06-06: 1000 mL via INTRAVENOUS

## 2024-06-06 NOTE — Discharge Instructions (Addendum)
 Your symptoms today are most consistent with gastritis, this is inflammation in the stomach that is likely a result of your recent GI illness.  Start Protonix and take 1 tablet by mouth once daily.  Start Carafate, take 1 tablet by mouth at mealtimes and prior to bedtime.  Start Zofran  and use every 8 hours as needed for nausea.  Follow-up with your PCP in regard to your symptoms.  If your symptoms persist despite initiation of these medications, I provided you the contact information for a gastroenterologist, please contact their office to schedule follow-up as needed.  Return to the emergency department if your symptoms worsen.

## 2024-06-06 NOTE — ED Triage Notes (Signed)
 Pt to ED reporting NVD with abd pain since Thursday with pain worse after eating. No fevers, children also reporting nausea at home but no VD.   Saturday evening patient reports the start of shortness of breath with difficulty breathing while laying flat.

## 2024-06-06 NOTE — ED Provider Notes (Signed)
 [facility] EMERGENCY DEPARTMENT AT [facility] Provider Note   CSN: [id number] Arrival date & time: 06/06/24  9173     Patient presents with: Abdominal Pain and Shortness of Breath   April Lucas is a 38 y.o. female.   38 year old female presenting with multiple complaints.  Patient notes that on Thursday morning she woke up feeling nauseated, around 5 PM that day she began to have multiple episodes of vomiting and diarrhea that lasted through Saturday.  She also notes subjective fever/chills from Thursday through Saturday.  Her nausea/vomiting/diarrhea has resolved, however she has not been able to eat very much since her symptoms began due to abdominal pain that is worse with eating.  Her children have been sick with similar symptoms at home, including fever and vomiting.  She also notes shortness of breath that is worse with lying flat, reports feeling out of breath when she does so, she also woke up this morning with a nonproductive cough.  Denies melena/hematochezia/hematemesis, dysuria, lower extremity edema, recent surgery/immobilization, recent extended travel, history of PE/DVT.  Last BM was yesterday and was normal for her.  She does not have any history of prior abdominal surgeries nor does she take any medications daily.   Abdominal Pain Associated symptoms: shortness of breath   Shortness of Breath Associated symptoms: abdominal pain        Prior to Admission medications   Medication Sig Start Date End Date Taking? Authorizing Provider  cyclobenzaprine  (FLEXERIL ) 10 MG tablet Take 1 tablet (10 mg total) by mouth 2 (two) times daily as needed for muscle spasms. 08/09/18   Kooistra, Kathryn Lorraine, CNM  ferrous sulfate  325 (65 FE) MG tablet Take 1 tablet (325 mg total) by mouth 3 (three) times daily with meals. Patient taking differently: Take 325 mg by mouth 2 (two) times daily with a meal.  05/12/16   Tod Ivanoff, NP  HYDROcodone -acetaminophen  (NORCO)  5-325 MG tablet Take 2 tablets by mouth every 6 (six) hours as needed for severe pain. 08/06/21   Molpus, John, MD  NIFEdipine  (ADALAT  CC) 30 MG 24 hr tablet Take 1 tablet (30 mg total) by mouth 2 (two) times daily. 08/07/18   Morris, Megan, DO  oseltamivir  (TAMIFLU ) 75 MG capsule Take 1 capsule (75 mg total) by mouth 2 (two) times daily. 08/09/18   Kooistra, Kathryn Lorraine, CNM  potassium chloride  SA (K-DUR,KLOR-CON ) 20 MEQ tablet Take 2 tablets (40 mEq total) by mouth daily for 7 days. 08/01/18 08/08/18  Sebastian Toribio GAILS, MD  Prenatal MV-Min-FA-Omega-3 (PRENATAL GUMMIES/DHA & FA) 0.4-32.5 MG CHEW Chew 1 each by mouth daily.    [provider]    Allergies: Ibuprofen     Review of Systems  Respiratory:  Positive for shortness of breath.   Gastrointestinal:  Positive for abdominal pain.    Updated Vital Signs  Vitals:   06/06/24 0834 06/06/24 0835 06/06/24 0837 06/06/24 0935  BP: (!) 142/91   116/72  Pulse:  90  71  Resp:   14 16  Temp:   97.8 F (36.6 C)   TempSrc:   Oral   SpO2:  100%  99%     Physical Exam Vitals and nursing note reviewed.  HENT:     Head: Normocephalic.  Eyes:     Extraocular Movements: Extraocular movements intact.  Cardiovascular:     Rate and Rhythm: Normal rate and regular rhythm.     Heart sounds: Normal heart sounds.  Pulmonary:     Effort: Pulmonary effort is normal.  Breath sounds: Normal breath sounds.  Abdominal:     Palpations: Abdomen is soft.     Tenderness: There is abdominal tenderness (mild, epigastric region). There is no guarding.  Musculoskeletal:     Cervical back: Normal range of motion.     Right lower leg: No edema.     Left lower leg: No edema.     Comments: Moves all extremities spontaneously without difficulty  Skin:    General: Skin is warm and dry.  Neurological:     Mental Status: She is alert and oriented to person, place, and time.     (all labs ordered are listed, but only abnormal results are  displayed) Labs Reviewed  CBC - Abnormal; Notable for the following components:      Result Value   RBC 5.22 (*)    MCV 73.6 (*)    MCH 23.4 (*)    All other components within normal limits  URINALYSIS, ROUTINE W REFLEX MICROSCOPIC - Abnormal; Notable for the following components:   Color, Urine COLORLESS (*)    Hgb urine dipstick SMALL (*)    All other components within normal limits  RESP PANEL BY RT-PCR (RSV, FLU A&B, COVID)  RVPGX2  LIPASE, BLOOD  COMPREHENSIVE METABOLIC PANEL WITH GFR  PREGNANCY, URINE  PRO BRAIN NATRIURETIC PEPTIDE    EKG: None  Radiology: DG Chest Portable 1 View Result Date: 06/06/2024 CLINICAL DATA:  Shortness of breath. EXAM: PORTABLE CHEST 1 VIEW COMPARISON:  08/05/2018 FINDINGS: The heart size and mediastinal contours are within normal limits. Both lungs are clear. No pleural effusion or pneumothorax. No acute osseous abnormality. IMPRESSION: No acute cardiopulmonary findings. Electronically Signed   By: Harrietta Sherry M.D.   On: 06/06/2024 10:21     Procedures   Medications Ordered in the ED  sodium chloride  0.9 % bolus 1,000 mL (has no administration in time range)  pantoprazole  (PROTONIX ) injection 40 mg (has no administration in time range)                                    Medical Decision Making This patient presents to the ED for concern of multiple complaints, this involves an extensive number of treatment options, and is a complaint that carries with it a high risk of complications and morbidity.  The differential diagnosis includes gastritis, PUD, viral gastroenteritis, COVID/flu/RSV, pancreatitis   Additional history obtained:  Additional history obtained from record review External records from outside source obtained and reviewed including prior UC note   Lab Tests:  I Ordered, and personally interpreted labs.  The pertinent results include: CBC unremarkable, no leukocytosis.  CMP within normal limits.  Lipase within  normal limits, 16.  Urine pregnancy test negative.  Urinalysis with small RBCs, no nitrites/leukocytes.  proBNP within normal limits, < 50.  COVID/flu/RSV negative   Imaging Studies ordered:  I ordered imaging studies including CXR   I independently visualized and interpreted imaging which showed No acute cardiopulmonary findings.  I agree with the radiologist interpretation   Cardiac Monitoring: / EKG:  The patient was maintained on a cardiac monitor.  I personally viewed and interpreted the cardiac monitored which showed an underlying rhythm of: NSR   Problem List / ED Course / Critical interventions / Medication management  I ordered medication including IVF bolus and protonix   for dehydration and suspected gastritis   Reevaluation of the patient after these medicines showed that the  patient improved I have reviewed the patients home medicines and have made adjustments as needed   Social Determinants of Health:  Former tobacco use   Test / Admission - Considered:  Physical exam is notable as above, patient does have some mild tenderness in the epigastric region but no guarding/rigidity.  Lungs are clear to auscultation bilaterally without adventitious sounds, PERC negative.  At this time I suspect the patient's symptoms are likely secondary to gastritis, given that she suffered from what is likely viral gastroenteritis from Thursday through Saturday, she does not continue to have nausea/vomiting/diarrhea.  Patient given IV fluids for rehydration and Protonix  for suspected gastritis. Labs are largely unremarkable as above, at time my reassessment patient notes symptomatic improvement which she attributes to administration of Protonix /fluids.  Again, her symptoms are most aligned with gastritis, I do not feel that she would benefit from a CT scan at this time given her generally reassuring appearance.  Vitals are within normal limits, she is afebrile and without tachycardia.    Etiology of shortness of breath is unclear, patient notes that this is typically only when she lays down at night, there may be an anxiety component to this as her chest x-ray does not show any evidence of cardiomegaly or vascular congestion, her BNP is normal, and she is PERC negative therefore does not require D-dimer/further imaging.  Oxygen saturation is within normal limits on room air, she denies shortness of breath on exertion and chest pain. Will prescribe patient with Protonix  and Carafate  as well as Zofran  to be used as needed for nausea, I will also provide her with contact information for gastroenterologist in the event that her symptoms persist.  She is established with a PCP, I recommend that she follow-up with her PCP as well.  Return precautions discussed, patient voiced understanding is in agreement this plan, she is appropriate for discharge at this time.  Amount and/or Complexity of Data Reviewed Labs: ordered. Radiology: ordered.  Risk Prescription drug management.       Final diagnoses:  Epigastric pain    ED Discharge Orders          Ordered    pantoprazole  (PROTONIX ) 20 MG tablet  Daily        06/06/24 1036    sucralfate  (CARAFATE ) 1 g tablet  3 times daily with meals & bedtime        06/06/24 1036    ondansetron  (ZOFRAN -ODT) 4 MG disintegrating tablet  Every 8 hours PRN        06/06/24 1036               Glendia Rocky SAILOR, PA-C 06/06/24 1041    Rogelia Jerilynn RAMAN, MD 06/14/24 1203
